# Patient Record
Sex: Male | Born: 1935 | Race: White | Hispanic: No | Marital: Married | Smoking: Former smoker
Health system: Southern US, Community
[De-identification: ages and names within clinical notes are randomized; demographics above are authoritative.]

## PROBLEM LIST (undated history)

## (undated) DIAGNOSIS — E119 Type 2 diabetes mellitus without complications: Secondary | ICD-10-CM

## (undated) DIAGNOSIS — F068 Other specified mental disorders due to known physiological condition: Secondary | ICD-10-CM

## (undated) DIAGNOSIS — I25810 Atherosclerosis of coronary artery bypass graft(s) without angina pectoris: Secondary | ICD-10-CM

## (undated) DIAGNOSIS — F341 Dysthymic disorder: Secondary | ICD-10-CM

## (undated) DIAGNOSIS — E785 Hyperlipidemia, unspecified: Secondary | ICD-10-CM

## (undated) DIAGNOSIS — I739 Peripheral vascular disease, unspecified: Secondary | ICD-10-CM

## (undated) DIAGNOSIS — H3530 Unspecified macular degeneration: Secondary | ICD-10-CM

## (undated) DIAGNOSIS — L84 Corns and callosities: Secondary | ICD-10-CM

## (undated) DIAGNOSIS — Z8619 Personal history of other infectious and parasitic diseases: Secondary | ICD-10-CM

## (undated) HISTORY — DX: Hyperlipidemia, unspecified: E78.5

## (undated) HISTORY — DX: Type 2 diabetes mellitus without complications: E11.9

## (undated) HISTORY — PX: CORONARY ARTERY BYPASS GRAFT: SHX141

## (undated) HISTORY — DX: Unspecified macular degeneration: H35.30

## (undated) HISTORY — DX: Atherosclerosis of coronary artery bypass graft(s) without angina pectoris: I25.810

## (undated) HISTORY — PX: OTHER SURGICAL HISTORY: SHX169

## (undated) HISTORY — DX: Other specified mental disorders due to known physiological condition: F06.8

## (undated) HISTORY — DX: Personal history of other infectious and parasitic diseases: Z86.19

## (undated) HISTORY — DX: Dysthymic disorder: F34.1

## (undated) HISTORY — DX: Peripheral vascular disease, unspecified: I73.9

## (undated) HISTORY — DX: Corns and callosities: L84

## (undated) HISTORY — PX: CARDIAC CATHETERIZATION: SHX172

---

## 1999-11-27 ENCOUNTER — Encounter: Payer: Self-pay | Admitting: Cardiothoracic Surgery

## 1999-11-27 ENCOUNTER — Inpatient Hospital Stay (HOSPITAL_COMMUNITY): Admission: AD | Admit: 1999-11-27 | Discharge: 1999-12-06 | Payer: Self-pay | Admitting: Cardiothoracic Surgery

## 1999-11-29 ENCOUNTER — Encounter: Payer: Self-pay | Admitting: Cardiothoracic Surgery

## 1999-11-30 ENCOUNTER — Encounter: Payer: Self-pay | Admitting: Cardiothoracic Surgery

## 1999-12-01 ENCOUNTER — Encounter: Payer: Self-pay | Admitting: Cardiothoracic Surgery

## 1999-12-02 ENCOUNTER — Encounter: Payer: Self-pay | Admitting: Cardiothoracic Surgery

## 2004-01-10 ENCOUNTER — Ambulatory Visit: Payer: Self-pay | Admitting: Internal Medicine

## 2004-02-02 ENCOUNTER — Encounter: Payer: Self-pay | Admitting: Internal Medicine

## 2004-03-01 ENCOUNTER — Encounter: Payer: Self-pay | Admitting: Internal Medicine

## 2004-03-29 ENCOUNTER — Encounter: Payer: Self-pay | Admitting: Internal Medicine

## 2004-04-29 ENCOUNTER — Encounter: Payer: Self-pay | Admitting: Internal Medicine

## 2004-05-22 ENCOUNTER — Ambulatory Visit: Payer: Self-pay | Admitting: Anesthesiology

## 2004-05-29 ENCOUNTER — Encounter: Payer: Self-pay | Admitting: Internal Medicine

## 2004-06-29 ENCOUNTER — Encounter: Payer: Self-pay | Admitting: Internal Medicine

## 2004-07-29 ENCOUNTER — Encounter: Payer: Self-pay | Admitting: Internal Medicine

## 2005-03-20 ENCOUNTER — Ambulatory Visit: Payer: Self-pay | Admitting: Gastroenterology

## 2005-03-26 ENCOUNTER — Ambulatory Visit: Payer: Self-pay | Admitting: Gastroenterology

## 2005-07-26 ENCOUNTER — Ambulatory Visit: Payer: Self-pay | Admitting: General Practice

## 2005-08-09 ENCOUNTER — Ambulatory Visit: Payer: Self-pay | Admitting: General Practice

## 2005-08-22 ENCOUNTER — Encounter: Payer: Self-pay | Admitting: General Practice

## 2005-08-28 ENCOUNTER — Emergency Department: Payer: Self-pay | Admitting: Emergency Medicine

## 2005-08-28 ENCOUNTER — Other Ambulatory Visit: Payer: Self-pay

## 2005-08-29 ENCOUNTER — Encounter: Payer: Self-pay | Admitting: General Practice

## 2006-08-06 ENCOUNTER — Ambulatory Visit: Payer: Self-pay | Admitting: Ophthalmology

## 2006-08-13 ENCOUNTER — Ambulatory Visit: Payer: Self-pay | Admitting: Ophthalmology

## 2007-04-19 ENCOUNTER — Emergency Department: Payer: Self-pay | Admitting: Emergency Medicine

## 2007-04-19 ENCOUNTER — Other Ambulatory Visit: Payer: Self-pay

## 2008-02-06 ENCOUNTER — Encounter: Payer: Self-pay | Admitting: Cardiovascular Disease

## 2008-02-10 ENCOUNTER — Encounter: Payer: Self-pay | Admitting: Cardiovascular Disease

## 2008-02-10 ENCOUNTER — Encounter: Payer: Self-pay | Admitting: *Deleted

## 2008-02-13 ENCOUNTER — Encounter: Payer: Self-pay | Admitting: Cardiovascular Disease

## 2008-02-19 ENCOUNTER — Inpatient Hospital Stay (HOSPITAL_BASED_OUTPATIENT_CLINIC_OR_DEPARTMENT_OTHER): Admission: RE | Admit: 2008-02-19 | Discharge: 2008-02-19 | Payer: Self-pay | Admitting: *Deleted

## 2008-03-23 ENCOUNTER — Encounter: Payer: Self-pay | Admitting: Cardiovascular Disease

## 2008-04-02 ENCOUNTER — Encounter: Payer: Self-pay | Admitting: Family Medicine

## 2008-05-26 ENCOUNTER — Inpatient Hospital Stay: Payer: Self-pay | Admitting: Internal Medicine

## 2008-10-01 ENCOUNTER — Ambulatory Visit: Payer: Self-pay | Admitting: Internal Medicine

## 2008-10-18 ENCOUNTER — Ambulatory Visit: Payer: Self-pay | Admitting: Gastroenterology

## 2008-11-29 ENCOUNTER — Encounter: Payer: Self-pay | Admitting: Cardiovascular Disease

## 2008-11-29 LAB — CONVERTED CEMR LAB
ALT: 16 units/L
AST: 14 units/L
Albumin: 4.3 g/dL
Alkaline Phosphatase: 45 units/L
BUN: 17 mg/dL
CO2: 31.2 meq/L
Calcium: 9.9 mg/dL
Chloride: 98 meq/L
Cholesterol: 178 mg/dL
Creatinine, Ser: 1 mg/dL
GFR calc Af Amer: >60 mL/min
GFR calc non Af Amer: >60 mL/min
Glucose, Bld: 169 mg/dL
HDL: 34.3 mg/dL
Hgb A1c MFr Bld: 7.1 %
LDL Cholesterol: 106.1 mg/dL
Potassium: 4.4 meq/L
Sodium: 136.9 meq/L
Total Bilirubin: 0.9 mg/dL
Total Protein: 7 g/dL
Triglyceride fasting, serum: 188 mg/dL

## 2009-02-15 ENCOUNTER — Ambulatory Visit: Payer: Medicare Other | Admitting: Unknown Physician Specialty

## 2009-06-14 ENCOUNTER — Ambulatory Visit: Payer: Self-pay | Admitting: Cardiovascular Disease

## 2009-06-14 DIAGNOSIS — E785 Hyperlipidemia, unspecified: Secondary | ICD-10-CM

## 2009-06-14 DIAGNOSIS — I25810 Atherosclerosis of coronary artery bypass graft(s) without angina pectoris: Secondary | ICD-10-CM

## 2009-06-16 ENCOUNTER — Encounter: Payer: Self-pay | Admitting: Cardiovascular Disease

## 2009-06-21 ENCOUNTER — Encounter: Payer: Self-pay | Admitting: Cardiovascular Disease

## 2009-06-30 ENCOUNTER — Telehealth: Payer: Self-pay | Admitting: Cardiovascular Disease

## 2009-08-18 ENCOUNTER — Telehealth: Payer: Self-pay | Admitting: Cardiovascular Disease

## 2009-09-05 ENCOUNTER — Encounter: Payer: Self-pay | Admitting: Cardiovascular Disease

## 2009-09-06 ENCOUNTER — Telehealth: Payer: Self-pay | Admitting: Cardiovascular Disease

## 2009-11-02 ENCOUNTER — Emergency Department: Payer: Medicare Other | Admitting: Emergency Medicine

## 2009-12-16 ENCOUNTER — Ambulatory Visit: Payer: Self-pay | Admitting: Cardiovascular Disease

## 2010-02-28 NOTE — Progress Notes (Signed)
Summary: bp medications  Phone Note Call from Patient Call back at Home Phone [phone]   Caller: self Call For: gollan Summary of Call: [facility] wants to know why the pt is on 3 different cholesterol medications-pt's wife would like a call back regarding this Initial call taken by: Harlon Flor,  September 06, 2009 11:37 AM  Follow-up for Phone Call        Wife is concerned that the pt is taking Plavix and aspirin 81 mg. Please advise.  Follow-up by: Benedict Needy, RN,  September 06, 2009 12:08 PM  Additional Follow-up for Phone Call Additional follow up Details #1::        He is on one cholesterol medication. Pravastatin 40 daily.  He is supposed to be alternating ASA 81 with plavix 75 every other day. He gets bad brusising. He is on these meds because he has severe CAD     Appended Document: bp medications LMOM TCB   Appended Document: bp medications spoke to wife she is aware of medications the pt is on and the reasons he is taking them.

## 2010-02-28 NOTE — Letter (Signed)
Summary: PHI  PHI   Imported By: Harlon Flor 06/15/2009 10:39:34  _____________________________________________________________________  External Attachment:    Type:   Image     Comment:   External Document

## 2010-02-28 NOTE — Assessment & Plan Note (Signed)
Summary: F6M/AMD   Visit Type:  Follow-up Referring Provider:  Dr Achilles Dunk Primary Provider:  Aram Beecham, MD  CC:  c/o some soreness in chest and back with exertion and just lying in bed.  He has pulled a muscle from working in the yard..  History of Present Illness: Shawn Zamora is a very pleasant 77 year old gentleman with a history of coronary artery disease, bypass surgery in 2001 with a vein graft to the LAD, vein graft to the ramus and vein graft to the PDA in October 2001, with catheterization for shortness of breath in January 2010 showing proximal LAD occlusion with left to left collaterals, 100% mid RCA disease, patent vein graft to the RCA, patent vein graft to the ramus with occluded vein graft to the LAD, ejection fraction 40%. He presents for routine followup.  He has a history of shortness of breath, generalized weakness requiring physical therapy. He does have rare episodes of left-sided chest pain when he sleeps on his left side though does not report significant chest discomfort with exertion. No significant lightheadedness or dizziness.  He does report significant bleeding from his nose and had 2 episodes that took him to the emergency room. He takes Plavix every other day. He had had his nose cauterized due to bleeding. 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  EKG shows normal sinus rhythm with rate 73 beats per minute, old inferior infarct, no other significant ST or T wave changes  Current Medications (verified): 1)  Glimepiride 4 Mg Tabs (Glimepiride) .Marland Kitchen.. 1 Tablet Two Times A Day 2)  Sertraline Hcl 50 Mg Tabs (Sertraline Hcl) .... 2 At Night 3)  Plavix 75 Mg Tabs (Clopidogrel Bisulfate) .... Take One Tablet By Mouth Every Other Day 4)  Omeprazole 20 Mg Cpdr (Omeprazole) .Marland Kitchen.. 1 Tablet Two Times A Day 5)  Ramipril 2.5 Mg Caps (Ramipril) .... Take One Capsule By Mouth Daily 6)  Aricept 5 Mg Tabs (Donepezil Hcl) .Marland Kitchen.. 1po Once Daily 7)  Lyrica 75  Mg Caps (Pregabalin) .... 3 By Mouth Daily If Needed Can Take More 8)  Lorazepam 0.5 Mg Tabs (Lorazepam) .Marland Kitchen.. 1 By Mouth Once Daily As Needed 9)  Metoprolol Tartrate 25 Mg Tabs (Metoprolol Tartrate) .... Take One Tablet By Mouth Twice A Day 10)  Imipramine Hcl 25 Mg Tabs (Imipramine Hcl) .Marland Kitchen.. 1 Tablet At Bedtime 11)  Valtrex 500 Mg Tabs (Valacyclovir Hcl) .... One Tablet Once Daily 12)  Pravastatin Sodium 40 Mg Tabs (Pravastatin Sodium) .... One Tablet At Bedtime  Allergies (verified): 1)  ! Codeine  Past History:  Past Surgical History: Last updated: 2009-07-14 cabg  Family History: Last updated: 2009-07-14 Father: deceased 52: tumor of the brain Mother: deceased 51: old age Sister: deceased 12: old age Brother: deceased 59: brain tumor  Social History: Last updated: Jul 14, 2009 Retired  Married  Tobacco Use - Former.  Alcohol Use - no Regular Exercise - no Drug Use - no  Risk Factors: Alcohol Use: 0 (14-Jul-2009) Caffeine Use: 2 cups per day (Jul 14, 2009) Exercise: no (2009/07/14)  Risk Factors: Smoking Status: quit (07-14-2009)  Review of Systems       The patient complains of difficulty walking.  The patient denies fever, weight loss, weight gain, vision loss, decreased hearing, hoarseness, chest pain, syncope, dyspnea on exertion, peripheral edema, prolonged cough, abdominal pain, incontinence, muscle weakness, depression, and enlarged lymph nodes.    Vital Signs:  Patient profile:   77 year old male Height:  74 inches Weight:      202.75 pounds BMI:     26.13 Pulse rate:   73 / minute BP sitting:   112 / 72  (left arm) Cuff size:   regular  Vitals Entered By: Adrian Prince (December 16, 2009 2:52 PM)  Physical Exam  General:  well-appearing elderly gentleman in no apparent distress, HEENT exam is benign, or perhaps is clear, neck is supple with no JVP or carotid bruits, heart sounds are regular with S1-S2 and no murmurs appreciated, lungs are  clear to auscultation with no wheezes Rales, abdominal exam is benign, no significant lower extremity edema, neurologic exam is grossly nonfocal, skin is warm and dry. Pulses are equal and symmetrical in his upper and lower extremities. Skin:  Intact without lesions or rashes. Psych:  Normal affect.   Impression & Recommendations:  Problem # 1:  CAD, ARTERY BYPASS GRAFT (ICD-414.04) Severe underlying coronary artery disease. We'll continue aggressive lipid management. Currently with no chest pain and no further workup needed.Marland Kitchen He is taking Plavix every other day secondary to significant nosebleeds requiring cauterization.  The following medications were removed from the medication list:    Aspirin 81 Mg Tbec (Aspirin) .Marland Kitchen... Take one tablet by mouth every other day His updated medication list for this problem includes:    Plavix 75 Mg Tabs (Clopidogrel bisulfate) .Marland Kitchen... Take one tablet by mouth every other day    Ramipril 2.5 Mg Caps (Ramipril) .Marland Kitchen... Take one capsule by mouth daily    Metoprolol Tartrate 25 Mg Tabs (Metoprolol tartrate) .Marland Kitchen... Take one tablet by mouth twice a day  Problem # 2:  HYPERLIPIDEMIA-MIXED (ICD-272.4) we will start him on Crestor 20 mg daily as his LDL is above goal. Ideally goal LDL should be less than 70 given his severe coronary artery disease. Once Lipitor dose generic, we will change him to Lipitor 40 mg daily. [facility]  The following medications were removed from the medication list:    Crestor 10 Mg Tabs (Rosuvastatin calcium) .Marland Kitchen... Take one tablet by mouth daily.    Crestor 20 Mg Tabs (Rosuvastatin calcium) .Marland Kitchen... Take one tablet by mouth daily. His updated medication list for this problem includes:    Lipitor 40 Mg Tabs (Atorvastatin calcium) .Marland Kitchen... Take one tablet by mouth daily.  Problem # 3:  DYSPNEA (ICD-786.05) He reports that his shortness of breath is stable. He is otherwise active with no significant complaints. No  signs of heart failure at this time.  The following medications were removed from the medication list:    Aspirin 81 Mg Tbec (Aspirin) .Marland Kitchen... Take one tablet by mouth every other day His updated medication list for this problem includes:    Ramipril 2.5 Mg Caps (Ramipril) .Marland Kitchen... Take one capsule by mouth daily    Metoprolol Tartrate 25 Mg Tabs (Metoprolol tartrate) .Marland Kitchen... Take one tablet by mouth twice a day  Patient Instructions: 1)  Your physician recommends that you schedule a follow-up appointment in: 6 months 2)  Your physician recommends that you return for a FASTING lipid profile: in 3 months (Lipid/Liver) 3)  Your physician has recommended you make the following change in your medication: Stop Pravastatin, Start Crestor samples 20mg  daily, once samples completed then fill Lipitor 40mg  once daily prescription.  Prescriptions: RAMIPRIL 2.5 MG CAPS (RAMIPRIL) Take one capsule by mouth daily  #90 x 3   Entered by:   Cloyde Reams RN   Authorized by:   Dossie Arbour MD  Signed by:   Cloyde Reams RN on 12/16/2009   Method used:   Faxed to ...       [facility] ([facility]order)       [address] [place], [place]  [address]       Ph: [phone]       Fax: [phone]   RxID:   [id number] METOPROLOL TARTRATE 25 MG TABS (METOPROLOL TARTRATE) Take one tablet by mouth twice a day  #180 x 3   Entered by:   Cloyde Reams RN   Authorized by:   Dossie Arbour MD   Signed by:   Cloyde Reams RN on 12/16/2009   Method used:   Faxed to ...       [facility] (mail[facility])       [address] [place], [place]  [address]       Ph: [phone]       Fax: [phone]   RxID:   [id number] LIPITOR 40 MG TABS (ATORVASTATIN CALCIUM) Take one tablet by mouth daily.  #30 x 1   Entered by:   Cloyde Reams RN   Authorized by:   Dossie Arbour MD   Signed by:   Cloyde Reams RN on 12/16/2009   Method used:   Electronically to        [facility] [phone]* (retail)       [address] [place], [place]  [address]       Ph: [phone] or [phone]       Fax: [phone]   RxID:   [id number] CRESTOR 20 MG TABS (ROSUVASTATIN CALCIUM) Take one tablet by mouth daily.  #42 x 0   Entered by:   Cloyde Reams RN   Authorized by:   Dossie Arbour MD   Signed by:   Cloyde Reams RN on 12/16/2009   Method used:   Samples Given   RxID:   [id number] RAMIPRIL 2.5 MG CAPS (RAMIPRIL) Take one capsule by mouth daily  #90 x 3   Entered by:   Cloyde Reams RN   Authorized by:   Dossie Arbour MD   Signed by:   Cloyde Reams RN on 12/16/2009   Method used:   Electronically to        [facility] [phone]* (retail)       [address] [place], [place]  [address]       Ph: [phone] or [phone]       Fax: [phone]   RxID:   [id number] METOPROLOL TARTRATE 25 MG TABS (METOPROLOL TARTRATE) Take one tablet by mouth twice a day  #180 x 3   Entered by:   Cloyde Reams RN   Authorized by:   Dossie Arbour MD   Signed by:   Cloyde Reams RN on 12/16/2009   Method used:   Electronically to        [facility] [phone]* (retail)       [address] [place], [place]  [address]       Ph: [phone] or [phone]       Fax: [phone]   RxID:   [id number] LIPITOR 40 MG TABS (  ATORVASTATIN CALCIUM) Take one tablet by mouth daily.  #90 x 3   Entered by:   Cloyde Reams RN   Authorized by:   Dossie Arbour MD   Signed by:   Cloyde Reams RN on 12/16/2009   Method used:   Electronically to        [facility] [phone]* (retail)       [address] [place], [place]  [address]       Ph: [phone] or [phone]       Fax: [phone]   RxID:   [id number]

## 2010-02-28 NOTE — Letter (Signed)
Summary: [facility]  [facility]   Imported By: Harlon Flor 09/06/2009 09:17:59  _____________________________________________________________________  External Attachment:    Type:   Image     Comment:   External Document

## 2010-02-28 NOTE — Miscellaneous (Signed)
Summary: Heart Track Order  Heart Track Order   Imported By: Harlon Flor 06/23/2009 12:32:34  _____________________________________________________________________  External Attachment:    Type:   Image     Comment:   External Document

## 2010-02-28 NOTE — Letter (Signed)
Summary: Medical Record Release  Medical Record Release   Imported By: Harlon Flor 09/08/2009 10:52:55  _____________________________________________________________________  External Attachment:    Type:   Image     Comment:   External Document

## 2010-02-28 NOTE — Progress Notes (Signed)
Summary: CLEARANCE  Phone Note Call from Patient Call back at Home Phone [phone]   Caller: Patient Call For: [facility] Summary of Call: PATIENT'S WIFE CALLED AND HUSBAND IS HAVING CORNEAL TRANSPLANT IN AUGUST AT [facility], WIFE WANTS TO KNOW IF DR.Khyren Hing THINKS PT IS STABLE ENOUGH TO GO THROUGH PROCEDURE. Initial call taken by: [place],  August 18, 2009 10:17 AM  Follow-up for Phone Call        pt last seen in office 5/17 Benedict Needy, RN  August 18, 2009 10:57 AM      Appended Document: CLEARANCE Cholesterol is too high Need to change pravastatin 40 to  crestor 10 mg daily (samples)  Is his breathing any better? Exercising? Does he want stress test to make sure no blockage? Would probably be ok for corneal transplant.   Appended Document: CLEARANCE    Clinical Lists Changes  Medications: Removed medication of PRAVASTATIN SODIUM 40 MG TABS (PRAVASTATIN SODIUM) Take one tablet by mouth daily at bedtime Added new medication of CRESTOR 10 MG TABS (ROSUVASTATIN CALCIUM) Take one tablet by mouth daily. - Signed Rx of CRESTOR 10 MG TABS (ROSUVASTATIN CALCIUM) Take one tablet by mouth daily.;  #28 x 0;  Signed;  Entered by: Benedict Needy, RN;  Authorized by: Dossie Arbour MD;  Method used: Samples Given    Prescriptions: CRESTOR 10 MG TABS (ROSUVASTATIN CALCIUM) Take one tablet by mouth daily.  #28 x 0   Entered by:   Benedict Needy, RN   Authorized by:   Dossie Arbour MD   Signed by:   Benedict Needy, RN on 08/19/2009   Method used:   Samples Given   RxID:   [id number]

## 2010-02-28 NOTE — Miscellaneous (Signed)
Summary: labs 11.01.10  Clinical Lists Changes  Observations: Added new observation of GFRAA: >60 (11/29/2008 7:33) Added new observation of GFR: >60 (11/29/2008 7:33) Added new observation of ALBUMIN: 4.3 g/dL (57/84/6962 9:52) Added new observation of PROTEIN, TOT: 7.0 g/dL (84/13/2440 1:02) Added new observation of CALCIUM: 9.9 mg/dL (72/53/6644 0:34) Added new observation of ALK PHOS: 45 units/L (11/29/2008 7:33) Added new observation of BILI TOTAL: 0.9 mg/dL (74/25/9563 8:75) Added new observation of SGPT (ALT): 16 units/L (11/29/2008 7:33) Added new observation of SGOT (AST): 14 units/L (11/29/2008 7:33) Added new observation of CO2 PLSM/SER: 31.2 meq/L (11/29/2008 7:33) Added new observation of CL SERUM: 98 meq/L (11/29/2008 7:33) Added new observation of K SERUM: 4.4 meq/L (11/29/2008 7:33) Added new observation of NA: 136.9 meq/L (11/29/2008 7:33) Added new observation of CREATININE: 1.0 mg/dL (64/33/2951 8:84) Added new observation of BUN: 17 mg/dL (16/60/6301 6:01) Added new observation of BG RANDOM: 169 mg/dL (09/32/3557 3:22) Added new observation of HGBA1C: 7.1 % (11/29/2008 7:32) Added new observation of TRIGLYCERIDE: 188 mg/dL (02/54/2706 2:37) Added new observation of HDL: 34.3 mg/dL (62/83/1517 6:16) Added new observation of LDL: 106.1 mg/dL (07/37/1062 6:94) Added new observation of CHOLESTEROL: 178 mg/dL (85/46/2703 5:00)      -  Date:  11/29/2008    Cholesterol: 178    LDL: 106.1    HDL: 34.3    Triglycerides: 188    HgbA1c: 7.1    BG Random: 169    BUN: 17    Creatinine: 1.0    Sodium: 136.9    Potassium: 4.4    Chloride: 98    CO2 Total: 31.2    SGOT (AST): 14    SGPT (ALT): 16    T. Bilirubin: 0.9    Alk Phos: 45    Calcium: 9.9    Total Protein: 7.0    Albumin: 4.3    GFR(Non African American): >60    GFR(African American) >60

## 2010-02-28 NOTE — Assessment & Plan Note (Signed)
Summary: NP6/AMD   Visit Type:  NP6 Referring Provider:  Dr Achilles Dunk Primary Provider:  Aram Beecham, MD  CC:  some chest pains and bruise his chest on the side by bending over a chair. some issues with reflux. shortness of breath with any physical exercise. no edema in ankles or feet. feeling tired and give out. Marland Kitchen  History of Present Illness: Shawn Zamora is a very pleasant 77 year old gentleman with a history of coronary artery disease, bypass surgery in 2001 with a vein graft to the LAD, vein graft to the ramus and vein graft to the PDA in October 2001, with catheterization for shortness of breath in January 2010 showing proximal LAD occlusion with left to left collaterals, 100% mid RCA disease, patent vein graft to the RCA, patent vein graft to the ramus with occluded vein graft to the LAD, ejection fraction 40%.  He states that since the fall, he's had worsening shortness of breath. He use to participate in the [facility] physical therapy/rehabilitation program and since he stopped, his conditioning has teary related and he is up and walking to any significant degree. He and his wife states that they watch TV, has not had sex in many years and to not do any exercise. His weight has been relatively stable. He denies any significant chest pain with exertion but does have shortness of breath.  He states that his metoprolol was stopped for some reason many months ago. His aspirin was also stopped and he takes Plavix every other day.  Preventive Screening-Counseling & Management  Alcohol-Tobacco     Alcohol drinks/day: 0     Smoking Status: quit  Caffeine-Diet-Exercise     Caffeine use/day: 2 cups per day     Does Patient Exercise: no      Drug Use:  no.    Current Medications (verified): 1)  Glimepiride 4 Mg Tabs (Glimepiride) .Marland Kitchen.. 1 By Mouth Once Daily 2)  Sertraline Hcl 50 Mg Tabs (Sertraline Hcl) .... 2 At Night 3)  Plavix 75 Mg Tabs (Clopidogrel Bisulfate) .... Take One Tablet By Mouth  Every Other Day 4)  Omeprazole 20 Mg Cpdr (Omeprazole) .Marland Kitchen.. 1po Once Daily 5)  Ramipril 2.5 Mg Caps (Ramipril) .... Take One Capsule By Mouth Daily 6)  Aricept 5 Mg Tabs (Donepezil Hcl) .Marland Kitchen.. 1po Once Daily 7)  Pravastatin Sodium 40 Mg Tabs (Pravastatin Sodium) .... Take One Tablet By Mouth Daily At Bedtime 8)  Lyrica 75 Mg Caps (Pregabalin) .... 3 By Mouth Daily If Needed Can Take More 9)  Lorazepam 0.5 Mg Tabs (Lorazepam) .Marland Kitchen.. 1 By Mouth Once Daily As Needed  Allergies (verified): 1)  ! Codeine  Past History:  Past Surgical History: cabg  Family History: Father: deceased 11: tumor of the brain Mother: deceased 78: old age Sister: deceased 54: old age Brother: deceased 59: brain tumor  Social History: Retired  Married  Tobacco Use - Former.  Alcohol Use - no Regular Exercise - no Drug Use - no Alcohol drinks/day:  0 Smoking Status:  quit Caffeine use/day:  2 cups per day Does Patient Exercise:  no Drug Use:  no  Review of Systems       The patient complains of dyspnea on exertion.  The patient denies fever, weight loss, weight gain, vision loss, decreased hearing, hoarseness, chest pain, syncope, peripheral edema, prolonged cough, abdominal pain, incontinence, muscle weakness, depression, and enlarged lymph nodes.    Vital Signs:  Patient profile:   77 year old male Height:  74 inches Weight:      200.50 pounds BMI:     25.84 Pulse rate:   91 / minute Pulse rhythm:   regular BP sitting:   94 / 68  (left arm) Cuff size:   regular  Vitals Entered By: Shawn Zamora (Jun 14, 2009 4:10 PM)  Physical Exam  General:  well-appearing elderly gentleman in no apparent distress, HEENT exam is benign, or perhaps is clear, neck is supple with no JVP or carotid bruits, heart sounds are regular with S1-S2 and no murmurs appreciated, lungs are clear to auscultation with no wheezes Rales, abdominal exam is benign, no significant lower extremity edema, neurologic exam is  grossly nonfocal, skin is warm and dry. Pulses are equal and symmetrical in his upper and lower extremities.    EKG  Procedure date:  06/14/2009  Findings:      normal sinus rhythm with APCs, rate 91 beats per minute, Q waves inferiorly consistent with old inferior infarct.  Impression & Recommendations:  Problem # 1:  CAD, ARTERY BYPASS GRAFT (ICD-414.04)  history of bypass surgery in 2001. We have evaluated his angiogram, the pictures and the report. He does have an occluded vein graft to the LAD. After discussion with him, it is difficult to determine if his shortness of breath is chronic and due to inactivity versus worsening coronary arterial disease. We've asked him to try to exercise for several weeks and if he does not feel like he is making any improvement, we could perform a pharmacologic stress test at that time.  He is asked [place] to about 6 and we have suggested that he try to get his conditioning a little better before engaging in this. He also states that he would like to try Viagra.  We will restart his metoprolol though at lower dose of 12.5 mg b.i.d. and will have them call him with his heart rates and blood pressures over the next week or 2.  We will have him try aspirin 81 mg every other day alternating with his Plavix 81 mg every other day so he has some antiplatelet activity every day. I hope to advance him to a baby aspirin every day if tolerated though he states he has significant bruising on his arms.  His updated medication list for this problem includes:    Plavix 75 Mg Tabs (Clopidogrel bisulfate) .Marland Kitchen... Take one tablet by mouth every other day    Ramipril 2.5 Mg Caps (Ramipril) .Marland Kitchen... Take one capsule by mouth daily    Metoprolol Tartrate 25 Mg Tabs (Metoprolol tartrate) .Marland Kitchen... Take one tablet by mouth twice a day    Aspirin 81 Mg Tbec (Aspirin) .Marland Kitchen... Take one tablet by mouth every other day  His updated medication list for this problem includes:    Plavix 75  Mg Tabs (Clopidogrel bisulfate) .Marland Kitchen... Take one tablet by mouth every other day    Ramipril 2.5 Mg Caps (Ramipril) .Marland Kitchen... Take one capsule by mouth daily    Metoprolol Tartrate 25 Mg Tabs (Metoprolol tartrate) .Marland Kitchen... Take one tablet by mouth twice a day  Problem # 2:  HYPERLIPIDEMIA-MIXED (ICD-272.4)  We'll try to obtain a copy of his most recent lipid panel.  His updated medication list for this problem includes:    Pravastatin Sodium 40 Mg Tabs (Pravastatin sodium) .Marland Kitchen... Take one tablet by mouth daily at bedtime  His updated medication list for this problem includes:    Pravastatin Sodium 40 Mg Tabs (Pravastatin sodium) .Marland Kitchen... Take one tablet by mouth  daily at bedtime  Problem # 3:  DYSPNEA (ICD-786.05)  Again, it is difficult to determine if his shortness of breath is due to a deconditioned state or to worsening coronary disease. We will have him exercise and perform a stress test at a later date if he has no improvement in SOB.Marland Kitchen  His updated medication list for this problem includes:    Ramipril 2.5 Mg Caps (Ramipril) .Marland Kitchen... Take one capsule by mouth daily    Metoprolol Tartrate 25 Mg Tabs (Metoprolol tartrate) .Marland Kitchen... Take one tablet by mouth twice a day    Aspirin 81 Mg Tbec (Aspirin) .Marland Kitchen... Take one tablet by mouth every other day  His updated medication list for this problem includes:    Ramipril 2.5 Mg Caps (Ramipril) .Marland Kitchen... Take one capsule by mouth daily    Metoprolol Tartrate 25 Mg Tabs (Metoprolol tartrate) .Marland Kitchen... Take one tablet by mouth twice a day  Patient Instructions: 1)  Your physician has recommended you make the following change in your medication: Start taking Metoprolol 25mg  1/2 tablet two times a day to start. Start taking 81mg  of aspirin every other day.  2)  Your physician has requested that you regularly monitor and record your blood pressure readings at home.  Please use the same machine at the same time of day to check your readings and call in 2 weeks with readings.     Prescriptions: CIALIS 5 MG TABS (TADALAFIL) Take 1 tablet by mouth once a day  #30 x 0   Entered by:   Cloyde Reams RN   Authorized by:   Dossie Arbour MD   Signed by:   Cloyde Reams RN on 06/14/2009   Method used:   Print then Give to Patient   RxID:   [id number] CIALIS 5 MG TABS (TADALAFIL) Take 1 tablet by mouth once a day  #30 x 0   Entered by:   Cloyde Reams RN   Authorized by:   Dossie Arbour MD   Signed by:   Cloyde Reams RN on 06/14/2009   Method used:   Electronically to        [facility] [phone]* (retail)       [address] [place], [place]  [address]       Ph: [phone] or [phone]       Fax: [phone]   RxID:   [id number] METOPROLOL TARTRATE 25 MG TABS (METOPROLOL TARTRATE) Take one tablet by mouth twice a day  #60 x 6   Entered by:   Cloyde Reams RN   Authorized by:   Dossie Arbour MD   Signed by:   Cloyde Reams RN on 06/14/2009   Method used:   Electronically to        [facility] [phone]* (retail)       [address] [place], [place]  [address]       Ph: [phone] or [phone]       Fax: [phone]   RxID:   [id number]

## 2010-02-28 NOTE — Progress Notes (Signed)
Summary: BP READINGS AND QUESTIONS  Phone Note Call from Patient Call back at Home Phone [phone]   Caller: WIFE Call For: GOLLAN Summary of Call: BP READINGS FOR THE PAST 2 WEEKS: 147/79-139/87-138/76-128/88-124/80-129/87-123/74-128/77-134/63-129/86 IN LEFT ARM 126/73 IN RIGHT ARM-126/74-124/69-123/69-124/76 THESE ARE ALL MORNING READINGS-THE EVENING READINGS: 130/76-110/65-121/80-130/76-123/79-120/72-117/72-120/72-125/72-110/71-117/72-110/62-HEART RATES ARE:73-74-76-62-68-78-75-82-63-77-82-74-57-77-79-79-50-74-80-55-85-81-68-PT WOULD LIKE TO KNOW WHAT THE PREVASTATIN IS FOR-PT DOES NOT SEE ANY CHANGES IN HIS BP READINGS FROM A MONTH AGO Initial call taken by: Harlon Flor,  June 30, 2009 4:03 PM  Follow-up for Phone Call        Talked to wife [facility]. Benedict Needy, RN  June 30, 2009 6:22 PM  [facility] Benedict Needy, RN  July 01, 2009 2:54 PM   per last ov note pt started on metoprolol 25 mg 1/2 tablet two times a day instructed to call in 2 weeks withh bp and hr.  Follow-up by: Benedict Needy, RN,  July 04, 2009 9:53 AM    Additional Follow-up for Phone Call Additional follow up Details #2::    Blood pressure and heart rate are good. Would continue metoprolol. Pravastatin is for cholesterol...which she needs.      Appended Document: BP READINGS AND QUESTIONS pt aware of Dr. Ethelene Hal recomendations.

## 2010-03-20 ENCOUNTER — Encounter: Payer: Self-pay | Admitting: Cardiovascular Disease

## 2010-03-20 ENCOUNTER — Other Ambulatory Visit (INDEPENDENT_AMBULATORY_CARE_PROVIDER_SITE_OTHER): Payer: Medicare Other

## 2010-03-20 DIAGNOSIS — E785 Hyperlipidemia, unspecified: Secondary | ICD-10-CM

## 2010-03-21 LAB — CONVERTED CEMR LAB
AST: 13 units/L (ref 0–37)
Albumin: 4.3 g/dL (ref 3.5–5.2)
Bilirubin, Direct: 0.2 mg/dL (ref 0.0–0.3)
HDL: 39 mg/dL — ABNORMAL LOW (ref >39)
LDL Cholesterol: 86 mg/dL (ref 0–99)
Total Bilirubin: 0.9 mg/dL (ref 0.3–1.2)
Total CHOL/HDL Ratio: 4.1
VLDL: 35 mg/dL (ref 0–40)

## 2010-04-03 LAB — HM DIABETES EYE EXAM

## 2010-04-04 ENCOUNTER — Other Ambulatory Visit: Payer: Self-pay | Admitting: Family Medicine

## 2010-04-04 ENCOUNTER — Encounter: Payer: Self-pay | Admitting: Family Medicine

## 2010-04-04 ENCOUNTER — Ambulatory Visit (INDEPENDENT_AMBULATORY_CARE_PROVIDER_SITE_OTHER): Payer: Medicare Other | Admitting: Family Medicine

## 2010-04-04 DIAGNOSIS — F068 Other specified mental disorders due to known physiological condition: Secondary | ICD-10-CM

## 2010-04-04 DIAGNOSIS — G3184 Mild cognitive impairment, so stated: Secondary | ICD-10-CM | POA: Insufficient documentation

## 2010-04-04 DIAGNOSIS — E785 Hyperlipidemia, unspecified: Secondary | ICD-10-CM

## 2010-04-04 DIAGNOSIS — E119 Type 2 diabetes mellitus without complications: Secondary | ICD-10-CM

## 2010-04-04 DIAGNOSIS — F321 Major depressive disorder, single episode, moderate: Secondary | ICD-10-CM | POA: Insufficient documentation

## 2010-04-04 DIAGNOSIS — F341 Dysthymic disorder: Secondary | ICD-10-CM

## 2010-04-04 DIAGNOSIS — L84 Corns and callosities: Secondary | ICD-10-CM | POA: Insufficient documentation

## 2010-04-04 DIAGNOSIS — I739 Peripheral vascular disease, unspecified: Secondary | ICD-10-CM

## 2010-04-04 DIAGNOSIS — Z79899 Other long term (current) drug therapy: Secondary | ICD-10-CM

## 2010-04-04 LAB — BASIC METABOLIC PANEL
CO2: 32 mEq/L (ref 19–32)
Calcium: 9.8 mg/dL (ref 8.4–10.5)
Chloride: 99 mEq/L (ref 96–112)
Glucose, Bld: 101 mg/dL — ABNORMAL HIGH (ref 70–99)
Potassium: 4.3 mEq/L (ref 3.5–5.1)
Sodium: 139 mEq/L (ref 135–145)

## 2010-04-10 ENCOUNTER — Telehealth: Payer: Self-pay | Admitting: Family Medicine

## 2010-04-11 NOTE — Miscellaneous (Signed)
Summary: Health Care Power of [facility] Power of Attorney   Imported By: Maryln Gottron 04/05/2010 15:56:13  _____________________________________________________________________  External Attachment:    Type:   Image     Comment:   External Document

## 2010-04-11 NOTE — Assessment & Plan Note (Signed)
Summary: new medicare pt   Vital Signs:  Patient profile:   77 year old male Height:      73 inches Weight:      200.75 pounds BMI:     26.58 Temp:     97.9 degrees F oral Pulse rate:   76 / minute Pulse rhythm:   regular BP sitting:   110 / 72  (left arm) Cuff size:   regular  Vitals Entered By: Benny Lennert CMA (AAMA) (April 04, 2010 10:00 AM)  History of Present Illness: Chief complaint new medicare patient  Here to establish.. previously saw Dr. Judithann Sheen. Last saw him few months ago.   DM... CBG 90-120 on glimperide and recetnly added janumet   History of coronary artery disease, bypass surgery in 2001 with a vein graft to the LAD, vein graft to the ramus and vein graft to the PDA in October 2001, with catheterization for shortness of breath in January 2010 showing proximal LAD occlusion with left to left collaterals, 100% mid RCA disease, patent vein graft to the RCA, patent vein graft to the ramus with occluded vein graft to the LAD, ejection fraction 40%. Sees Dr. Mariah Milling.. last note 11/2009.  No further dyspnea per pt today. On metoprolol and ramipril.  High cholesterol... on liptior 40 mg daily. Working on [employer].  Anxiety/depression.. has been on sertraline for quite some time. On imipramine at bedtime.. without this a lot of anxiety.  Only using ativan for anxiety... about twice a month.  Dementia.. stable on aricept  Hx of shingles 10 years ago .Marland Kitchen left eye /cornea replacement.Marland Kitchen on lyrica for pain/itching... three times a day... started 10 years ago 1 year after symptoms did not resolve.  Nothing else helped.  Corns and calluses.. sees Podiatrist for maintanance... [facility].  PAD.Marland Kitchen 1 year.Marland Kitchen abnormal ABIs.Marland Kitchen at vascular MD.. told needs to follow.  Dr. Forde Dandy.   Allergies: 1)  ! Codeine  Physical Exam  General:  overweight male in NAD  Eyes:  No corneal or conjunctival inflammation noted. EOMI. Perrla. Funduscopic exam benign, without hemorrhages,  exudates or papilledema. Vision grossly normal. Ears:  External ear exam shows no significant lesions or deformities.  Otoscopic examination reveals clear canals, tympanic membranes are intact bilaterally without bulging, retraction, inflammation or discharge. Hearing is grossly normal bilaterally. Nose:  External nasal examination shows no deformity or inflammation. Nasal mucosa are pink and moist without lesions or exudates. Mouth:  poor dentition Neck:  no carotid bruit or thyromegaly no cervical or supraclavicular lymphadenopathy  Lungs:  Normal respiratory effort, chest expands symmetrically. Lungs are clear to auscultation, no crackles or wheezes. Heart:  distant heart sounds, no murmer, no rubs, no gallops Abdomen:  Bowel sounds positive,abdomen soft and non-tender without masses, organomegaly or hernias noted. Pulses:  1 plus B pedal pulses Extremities:  no edema Skin:  Intact without suspicious lesions or rashes Psych:  Cognition and judgment appear intact. Alert and cooperative with normal attention span and concentration. No apparent delusions, illusions, hallucinations   Impression & Recommendations:  Problem # 1:  DIABETES MELLITUS (ICD-250.00) Due fro reeval. Encouraged exercise, weight loss, healthy eating habits.  His updated medication list for this problem includes:    Glimepiride 4 Mg Tabs (Glimepiride) .Marland Kitchen... 1 tablet two times a day    Ramipril 2.5 Mg Caps (Ramipril) .Marland Kitchen... Take one capsule by mouth daily    Janumet 50-500 Mg Tabs (Sitagliptin-metformin hcl) .Marland Kitchen... 1 tab by mouth daily  Orders: TLB-A1C / Hgb A1C (  Glycohemoglobin) (83036-A1C) TLB-BMP (Basic Metabolic Panel-BMET) (80048-METABOL)  Problem # 2:  HYPERLIPIDEMIA-MIXED (ICD-272.4) recent eval.. almost at goal on lipitor. Work on diet and exercise. Recheck in 3 months.  His updated medication list for this problem includes:    Lipitor 40 Mg Tabs (Atorvastatin calcium) .Marland Kitchen... Take one tablet by mouth  daily.  Problem # 3:  ANXIETY DEPRESSION (ICD-300.4) On many medicaitons... he does have come dizziness, constipation, and fatigue.. ? SE to all him meds.  Will consider try to wean off some of these as able.   Limit ativan use.   Problem # 4:  ? of PERIPHERAL VASCULAR DISEASE (ICD-443.9) Return to vascular MD for reeval.  His updated medication list for this problem includes:    Plavix 75 Mg Tabs (Clopidogrel bisulfate) .Marland Kitchen... Take one tablet by mouth every other day  Problem # 5:  DEMENTIA (ICD-294.8) Stable on aricept.   Complete Medication List: 1)  Glimepiride 4 Mg Tabs (Glimepiride) .Marland Kitchen.. 1 tablet two times a day 2)  Sertraline Hcl 50 Mg Tabs (Sertraline hcl) .... 2 at night 3)  Plavix 75 Mg Tabs (Clopidogrel bisulfate) .... Take one tablet by mouth every other day 4)  Omeprazole 20 Mg Cpdr (Omeprazole) .Marland Kitchen.. 1 tablet two times a day 5)  Ramipril 2.5 Mg Caps (Ramipril) .... Take one capsule by mouth daily 6)  Aricept 5 Mg Tabs (Donepezil hcl) .Marland Kitchen.. 1po once daily 7)  Lyrica 75 Mg Caps (Pregabalin) .... 3 by mouth daily if needed can take more 8)  Lorazepam 0.5 Mg Tabs (Lorazepam) .Marland Kitchen.. 1 by mouth once daily as needed 9)  Metoprolol Tartrate 25 Mg Tabs (Metoprolol tartrate) .... Take one tablet by mouth twice a day 10)  Imipramine Hcl 25 Mg Tabs (Imipramine hcl) .Marland Kitchen.. 1 tablet at bedtime 11)  Valtrex 500 Mg Tabs (Valacyclovir hcl) .... One tablet once daily 12)  Lipitor 40 Mg Tabs (Atorvastatin calcium) .... Take one tablet by mouth daily. 13)  Ocuvite-lutein Caps (Multiple vitamins-minerals) .... Take one tablet in am and one in pm 14)  Potassium Gluconate 550 Mg Tabs (Potassium gluconate) .... Take one tablet daily 15)  Multivitamins Caps (Multiple vitamin) .... Take one tablet dialy 16)  Glucosamine-chondroitin 1500-1200 Mg/41ml Liqd (Glucosamine-chondroitin) .... Take one tablet dialy 17)  Janumet 50-500 Mg Tabs (Sitagliptin-metformin hcl) .Marland Kitchen.. 1 tab by mouth daily  Patient  Instructions: 1)  Work on increasing exercise and weight loss. 2)   Decrease butter in diet... try to replace with olive oil/ canola. 3)  Please schedule a follow-up appointment in 3 months .  4)  BMP prior to visit, ICD-9: 250.00 all 5)  Hepatic Panel prior to visit ICD-9:  6)  Lipid panel prior to visit ICD-9 :  7)  HgBA1c prior to visit  ICD-9:  8)  Urine Microalbumin prior to visit ICD-9 :  9)  Return to see vascular doctor for recheck of legs.   Orders Added: 1)  TLB-A1C / Hgb A1C (Glycohemoglobin) [83036-A1C] 2)  TLB-BMP (Basic Metabolic Panel-BMET) [80048-METABOL] 3)  Est. Patient Level IV [04540] 4)  New Patient Level III [98119]    Current Allergies (reviewed today): ! CODEINE  Flu Vaccine Result Date:  01/30/2008 Flu Vaccine Result:  given Flu Vaccine Next Due:  1 yr Pneumovax Result Date:  01/30/2008 Pneumovax Result:  given Pneumovax Next Due:  Not Indicated Colonoscopy Next Due:  Not Indicated PSA Next Due:  Not Indicated

## 2010-04-13 ENCOUNTER — Telehealth: Payer: Self-pay | Admitting: Family Medicine

## 2010-04-18 NOTE — Progress Notes (Signed)
Summary: needs referral to vein clinic  Phone Note Call from Patient Call back at Home Phone [phone]   Caller: Spouse Summary of Call: Pt wants referral to go to to [facility], Dr. Earnestine Leys. He was told at his last office visit to follow up there.   He was previously a pt there, but they have told him he would need a referral. Initial call taken by: Lowella Petties CMA, AAMA,  April 13, 2010 5:21 PM  Follow-up for Phone Call        referral sent  Follow-up by: Kerby Nora MD,  April 14, 2010 4:57 PM

## 2010-04-18 NOTE — Progress Notes (Signed)
Summary: lyrica   Phone Note Refill Request Message from:  Fax from Pharmacy on April 10, 2010 9:52 AM  Refills Requested: Medication #1:  LYRICA 75 MG CAPS 3 by mouth daily if needed can take more [facility] [address] [phone].   Initial call taken by: Melody Comas,  April 10, 2010 9:53 AM  Follow-up for Phone Call        Rx called to pharmacy Follow-up by: Benny Lennert CMA (AAMA),  April 10, 2010 10:12 AM    New/Updated Medications: LYRICA 75 MG CAPS (PREGABALIN) 3 by mouth daily Prescriptions: LYRICA 75 MG CAPS (PREGABALIN) 3 by mouth daily  #90 x 5   Entered and Authorized by:   Kerby Nora MD   Signed by:   Kerby Nora MD on 04/10/2010   Method used:   Telephoned to ...       [facility] [phone]* (retail)       [address] [place], [place]  [address]       Ph: [phone] or [phone]       Fax: [phone]   RxID:   [id number]

## 2010-04-29 ENCOUNTER — Other Ambulatory Visit: Payer: Self-pay | Admitting: Family Medicine

## 2010-05-01 ENCOUNTER — Other Ambulatory Visit: Payer: Self-pay | Admitting: *Deleted

## 2010-05-01 MED ORDER — SITAGLIPTIN PHOS-METFORMIN HCL 50-500 MG PO TABS: ORAL_TABLET | ORAL | Status: DC

## 2010-05-15 ENCOUNTER — Encounter: Payer: Self-pay | Admitting: Family Medicine

## 2010-05-15 ENCOUNTER — Telehealth: Payer: Self-pay | Admitting: *Deleted

## 2010-05-15 ENCOUNTER — Ambulatory Visit (INDEPENDENT_AMBULATORY_CARE_PROVIDER_SITE_OTHER): Payer: Medicare Other | Admitting: Family Medicine

## 2010-05-15 ENCOUNTER — Ambulatory Visit: Payer: Medicare Other | Admitting: Family Medicine

## 2010-05-15 VITALS — BP 100/80 | HR 84 | Temp 97.5°F | Ht 75.0 in | Wt 200.8 lb

## 2010-05-15 DIAGNOSIS — R9389 Abnormal findings on diagnostic imaging of other specified body structures: Secondary | ICD-10-CM

## 2010-05-15 DIAGNOSIS — R918 Other nonspecific abnormal finding of lung field: Secondary | ICD-10-CM

## 2010-05-15 DIAGNOSIS — R0602 Shortness of breath: Secondary | ICD-10-CM

## 2010-05-15 DIAGNOSIS — M549 Dorsalgia, unspecified: Secondary | ICD-10-CM

## 2010-05-15 DIAGNOSIS — M546 Pain in thoracic spine: Secondary | ICD-10-CM

## 2010-05-15 DIAGNOSIS — Z136 Encounter for screening for cardiovascular disorders: Secondary | ICD-10-CM

## 2010-05-15 LAB — POCT URINALYSIS DIPSTICK
Bilirubin, UA: NEGATIVE
Blood, UA: NEGATIVE
Glucose, UA: NEGATIVE
Spec Grav, UA: 1.01

## 2010-05-15 NOTE — Progress Notes (Signed)
  Subjective:    Patient ID: Shawn Zamora, male    DOB: 06-08-1923, 77 y.o.   MRN: [id number]  HPI  77 year old male with history of CAD presents with left mid upper back pain 3 weeks ago There is a very specific spot that hurts the most.  Seems to come and go... Has had three episodes.. 1 week in between episodes. Last episode 3 days ago.Vincente Poli.  Woke him up at night with pain last night. No pain with movement of left shoulder. Deep breaths makes back hurt more. No exertional pain..but has not been moving around a lot.  Some odor in urine and darkness of color in last few months. No new meds.  Using heating pad, liniment, tylenol...helps minimally.  No known falls or injuries (except 4 months ago fell in parking lot)... No change in activiity except 1 week ago did lifts some mild back pain.  Wife notes in past MI...had back pain. Cardiologist.. Dr. Mariah Milling.... Seen in 03/2010 (Reviewed note)... Stable EKG. No symptoms requiring work up at that time. History of coronary artery disease, bypass surgery in 2001 with a vein graft to the LAD, vein graft to the ramus and vein graft to the PDA in October 2001, with catheterization for shortness of breath in January 2010 showing proximal LAD occlusion with left to left collaterals, 100% mid RCA disease, patent vein graft to the RCA, patent vein graft to the ramus with occluded vein graft to the LAD, ejection fraction 40%. Treating with med management.    Review of Systems  Constitutional: Negative for fatigue and unexpected weight change.  HENT: Positive for rhinorrhea and postnasal drip. Negative for ear pain and congestion.   Respiratory: Positive for cough. Negative for shortness of breath and wheezing.        [Mild occ cough, nonproductive Cardiovascular: Negative for palpitations and leg swelling.  Musculoskeletal: Negative for myalgias.       Objective:   Physical Exam  Constitutional: Vital signs are normal. He appears  well-developed and well-nourished.  HENT:  Head: Normocephalic.  Right Ear: Hearing normal.  Left Ear: Hearing normal.  Nose: Nose normal.  Mouth/Throat: Oropharynx is clear and moist and mucous membranes are normal.  Neck: Trachea normal. Carotid bruit is not present. No mass and no thyromegaly present.  Cardiovascular: Normal rate, regular rhythm and normal pulses.  Exam reveals distant heart sounds. Exam reveals no gallop and no friction rub.   Murmur heard.  Systolic murmur is present       No peripheral edema  Pulmonary/Chest: Effort normal and breath sounds normal. No respiratory distress.   He exhibits tenderness. Right breast exhibits no mass and no skin change. Left breast exhibits no mass and no skin change.    Abdominal: Soft. Normal appearance. Bowel sounds are increased. There is no tenderness.  Musculoskeletal:       Left shoulder: He exhibits no tenderness, no deformity and normal strength.       Thoracic back: He exhibits no tenderness and no bony tenderness.       Lumbar back: He exhibits no tenderness and no bony tenderness.  Skin: Skin is warm, dry and intact. No rash noted. Rash is not vesicular.       No skin hypersensitivity suggesting zoster  Psychiatric: He has a normal mood and affect. His speech is normal and behavior is normal. Thought content normal.          Assessment & Plan:

## 2010-05-15 NOTE — Assessment & Plan Note (Addendum)
Given CAD... And past atypical presentation..EKG performed EKG showed no change from previous 11/2009 performed at cards office (cannot find 03/2010 EKG in EMR)... Old inferior MI. NSR.  Urine shows no suggestion of kidney infection and presentation of this would be very atypical.  I recommend evaluating further for rib fracture given focal area of pain and age.. As well as lung eval although lung exam is clear. He does have some mild cough.  No change in SOB.. So PE unlikely.

## 2010-05-15 NOTE — Patient Instructions (Addendum)
Stop at front desk to schedule returning for CXR TOMMOROW. We will call you with lab/X-ray results. Go to ER if severe pain, chest pain or shortness of breath.

## 2010-05-15 NOTE — Telephone Encounter (Signed)
Form for diabetic supplies is on your desk, I checked with the pt and he does want these supplies

## 2010-05-16 ENCOUNTER — Ambulatory Visit (INDEPENDENT_AMBULATORY_CARE_PROVIDER_SITE_OTHER)
Admission: RE | Admit: 2010-05-16 | Discharge: 2010-05-16 | Disposition: A | Discharge status: Home / Self Care | Payer: Medicare Other | Source: Ambulatory Visit | Attending: Family Medicine | Admitting: Family Medicine

## 2010-05-16 ENCOUNTER — Other Ambulatory Visit: Payer: Medicare Other

## 2010-05-16 DIAGNOSIS — M546 Pain in thoracic spine: Secondary | ICD-10-CM

## 2010-05-16 DIAGNOSIS — R9389 Abnormal findings on diagnostic imaging of other specified body structures: Secondary | ICD-10-CM

## 2010-05-16 DIAGNOSIS — R918 Other nonspecific abnormal finding of lung field: Secondary | ICD-10-CM

## 2010-05-16 DIAGNOSIS — R0602 Shortness of breath: Secondary | ICD-10-CM

## 2010-05-16 MED ORDER — IOHEXOL 300 MG/ML  SOLN
80.0000 mL | Freq: Once | INTRAMUSCULAR | Status: AC | PRN
  Administered 2010-05-16: 80 mL via INTRAVENOUS

## 2010-05-16 NOTE — Progress Notes (Signed)
Addended byKerby Nora on: 05/16/2010 11:17 AM   Modules accepted: Orders

## 2010-05-16 NOTE — Progress Notes (Signed)
  Subjective:    Patient ID: Shawn Zamora, male    DOB: [birth year], 77 y.o.   MRN: [id number]  HPI  CXR shows effusion moderate size on right....discussed with pt and wide... Will try to get CT scan with contrast ASAP.   Review of Systems     Objective:   Physical Exam        Assessment & Plan:

## 2010-05-16 NOTE — Progress Notes (Signed)
Addended byMills Koller on: 05/16/2010 11:42 AM   Modules accepted: Orders

## 2010-05-31 ENCOUNTER — Other Ambulatory Visit: Payer: Self-pay | Admitting: *Deleted

## 2010-05-31 MED ORDER — PREGABALIN 75 MG PO CAPS: 75.0000 mg | ORAL_CAPSULE | Freq: Three times a day (TID) | ORAL | Status: DC

## 2010-06-05 ENCOUNTER — Other Ambulatory Visit: Payer: Self-pay | Admitting: *Deleted

## 2010-06-05 MED ORDER — VALACYCLOVIR HCL 500 MG PO TABS: 500.0000 mg | ORAL_TABLET | Freq: Every day | ORAL | Status: DC

## 2010-06-07 ENCOUNTER — Telehealth: Payer: Self-pay | Admitting: *Deleted

## 2010-06-07 NOTE — Telephone Encounter (Signed)
Prior Berkley Harvey is needed for valtrex, form is on your desk.

## 2010-06-13 NOTE — Cardiovascular Report (Signed)
NAME:  Shawn Zamora, Shawn Zamora NO.:  [id number]   MEDICAL RECORD NO.:  [id number]          PATIENT TYPE:  OIB   LOCATION:  1965                         FACILITY:  [facility]   PHYSICIAN:  Elmore Guise., M.D.DATE OF BIRTH:  31-Aug-1923   DATE OF PROCEDURE:  02/19/2008  DATE OF DISCHARGE:  02/19/2008                            CARDIAC CATHETERIZATION   INDICATIONS FOR PROCEDURE:  Abnormal stress test, history of coronary  artery disease, now with progressive dyspnea.   DESCRIPTION OF PROCEDURE:  The patient was brought to the Cardiac Cath  Lab.  After appropriate informed consent, he was prepped and draped in a  sterile fashion.  Approximately 10 mL of 1% lidocaine was used for local  anesthesia.  A 4-French sheath was placed in the right femoral artery  without difficulty.  Coronary angiography, LV angiography, vein graft  angiography, and aortic root angiography were performed.  The patient  tolerated the procedure well with no apparent complications.  Total  contrast used during the case was 125 mL.   FINDINGS:  1. Left Main:  Mild tapering.  2. LAD:  100% proximal occlusion.  Fills late with left to left      collaterals.  3. LCX:  Small caliber vessel with mild-to-moderate luminal      irregularities.  4. OM-1:  Patent with mild-to-moderate luminal irregularities.  5. RCA:  60% proximal stenosis with a 100% mid occlusion.  6. Vein graft to distal RCA is patent with good distal flow.  PDA has      mild-to-moderate diffuse disease after insertion of his vein graft.  7. Vein graft to ramus intermedius is patent with good distal flow.      There are faint collaterals seen filling the LAD.  8. Vein graft to LAD is proximally occluded.  9. LV:  EF is 40% with mild inferior wall hypokinesis.  LVEDP is 21      mmHg.  10.Aortic root angiogram was performed showing normal-appearing aortic      root with no further vein graft visualized.   IMPRESSION:  1. Severe  obstructive native vessel disease.  2. Occluded vein graft to the left anterior descending.  3. Patent vein graft to the right coronary artery and ramus      intermedius  4. Mild left ventricular dysfunction, ejection fraction of 40%.   PLAN:  1. At this time, we will add Plavix to his regimen.  We will continue      aggressive medical therapy as listed.  From his native vessels as      well as bypass, no indication for pursuing intervention or redo      CABG needed at this time.      Elmore Guise., M.D.  Electronically Signed     TWK/MEDQ  D:  02/19/2008  T:  02/20/2008  Job:  [id number]

## 2010-06-16 NOTE — Discharge Summary (Signed)
[place]. [facility]  Patient:    Shawn Zamora, Shawn Zamora                MRN: [id number] Adm. Date:  [id number] Disc. Date: [id number] Attending:  Mikey Bussing Dictator:   Adair Patter, P.A.                           Discharge Summary  DATE OF BIRTH:  02/25/1923  ADMISSION DIAGNOSES: 1. Coronary artery disease. 2. Inferior myocardial infarction.  DISCHARGE DIAGNOSES: 1. Coronary artery disease. 2. Status post coronary artery bypass graft.  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  Shawn Zamora initially presented on October 20 with new onset chest pain at which time his wife took him to the emergency room where he was found to have acute myocardial infarction found by cardiac enzymes.  He underwent cardiac catheterization November 20, 1999, which revealed complex three-vessel coronary artery disease and ejection fraction of 40%. DD:  12/05/99 TD:  12/06/99 Job: [id number]

## 2010-06-16 NOTE — Op Note (Signed)
[place]. [facility]  Patient:    Shawn Zamora, Shawn Zamora                MRN: [id number] Proc. Date: 11/29/99 Adm. Date:  [id number] Attending:  Mikey Bussing CC:         CVTS office  Dr. Darrold Junker, [facility] in [place]   Operative Report  PREOPERATIVE DIAGNOSIS:  Class 4 post-infarction unstable angina with severe three vessel coronary artery disease, status post recent diaphragmatic myocardial infarct.  POSTOPERATIVE DIAGNOSIS:  Class 4 post-infarction unstable angina with severe three vessel coronary artery disease, status post recent diaphragmatic myocardial infarct.  OPERATION:  Coronary artery bypass grafting x 3 (saphenous vein graft to LAD, saphenous vein graft to ramus intermediate, and saphenous vein graft to posterior descending).  SURGEON:  Mikey Bussing, M.D.  ASSISTANT:  Maxwell Marion, RNFA.  ANESTHESIA:  General.  INDICATIONS:  The patient is a 77 year old white male who presented to [facility] with acute chest pain and ruled in for MI with a CPKMB of 280.  He was treated with _______ and heparin and nitroglycerin.  He underwent catheterization by Dr. Darrold Junker which demonstrated severe three vessel coronary disease with ejection fraction of 40%.  He was referred for surgical revascularization.  Prior to the operation, I examined the patient in his hospital room and reviewed the results of his cardiac catheterization with the patient and wife. I discussed the indications and expected benefits of coronary bypass grafting. The discussed the major aspects of the operation including the choice of conduit, the use of cardiopulmonary bypass and general anesthesia, the locations of surgical incisions, and the expected hospital recovery.  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  He understood the  alternatives of surgical therapy for his coronary disease, as well as the risks involved of MI, CVA, bleeding, infection, and death.  All questions were answered and the patient agreed to proceed with the operation as planned under informed consent.  OPERATIVE FINDINGS:  The saphenous vein was of good quality.  The mammary artery was dissected and harvested, but had suboptimal flow and was significantly smaller than the vein, so it was not used.  The distal circumflex was not graftable.  There was a recent inferior wall MI which was mild to moderate in severity.  DESCRIPTION OF PROCEDURE:  The patient was brought to the operating room and placed supine on the operating room table where general anesthesia was induced under invasive hemodynamic monitoring.  The chest, abdomen, and legs were prepped with Betadine, and draped as a sterile field.  A median sternotomy was performed as the saphenous vein was harvested from the right leg.  The left internal mammary artery was harvested as a pedicle graft from its origin of the subclavian veins, but was with suboptimal flow, and was not an adequate vessel to use for grafting.  Heparin was administered and the ACT was documented was being therapeutic.  The aprotinin protocol was used for this operation.  The pursestring was placed in the ascending aorta and right atrium.  The patient was placed on bypass and cooled to 28 degrees.  The coronaries were identified and the vein grafts were prepared for the distal anastomoses.  A cardioplegia cannula was placed and as the aortic cross-clamp was applied, 500 cc of cold blood cardioplegia was delivered to the aortic root with immediate cardioplegic arrest  and the septal temperature dropping less than 12 degrees.  Topical ice saline slush was used to augment myocardial preservation and a pericardial insufflator pad used to protect the left phrenic nerve.  The distal coronary anastomoses were then  performed.  The first distal anastomosis was to the distal LAD.  This is a 2.0 mm vessel with proximal 80% stenosis and a reverse saphenous vein was sewn end-to-side with a running 7-0 Prolene.  The second distal anastomosis was to the ramus intermediate which bifurcated into a medial and lateral branch.  The larger medial branch was used to construct an end-to-side anastomosis with a reverse saphenous vein using running 7-0 Prolene and cardioplegia was redosed.  The third distal anastomosis was to the posterior descending which was a 1.8 mm vessel with proximal 95% stenosis.  A reverse saphenous vein was sewn end-to-side with a running 7-0 Prolene with good flow through the graft.  The cross-clamp was then removed and the heart was cardioverted back to a regular rhythm.  Using a partial occluding clamp, ______ proximal vein anastomoses were performed using a 4.0 mm punch and running 6-0 Prolene.  The partial clamp was removed and the vein grafts were perfused.  Each had excellent flow and hemostasis was documented at the proximal and distal sites. The patient was rewarmed to 37 degrees and temporary pacing wires were applied.  The lungs were re-expanded and the ventilator was turned back on and the patient weaned from cardiopulmonary bypass on low dose dopamine with excellent hemodynamics and stable blood pressure.  Protamine was administered and the cannulas were removed.  The mediastinum was irrigated with warm antibiotic irrigation.  The leg incision was irrigated and closed in a standard fashion.  The superior pericardium was closed over the aortic vein grafts, and two mediastinal and a left pleural chest tube were placed, and brought out through separate incisions.  The sternum was reapproximated with interrupted steel wire and _______ fascia.  The subcutaneous layers and skin were closed with a running Vicryl.  Total cardiopulmonary bypass time was 100 minutes with aortic  cross-clamp time  of 30 minutes.  The hematocrit at the end of the operation was 34%.  No blood products were given. DD:  11/29/99 TD:  11/29/99 Job: [id number]

## 2010-06-16 NOTE — H&P (Signed)
[place]. [facility]  Patient:    Shawn Zamora, Shawn Zamora                MRN: [id number] Adm. Date:  [id number] Attending:  Mikey Bussing Dictator:   Lissa Merlin, P.A.C. CC:         Marcina Millard, M.D.  Bethann Punches, M.D.   History and Physical  DATE OF BIRTH:  October 18, 1997.  CARDIOLOGIST:  Marcina Millard, M.D.  PRIMARY CARE PHYSICIAN:  Bethann Punches, M.D.  CHIEF COMPLAINT:  Status post acute inferior myocardial infarction on November 18, 1999.  HISTORY OF PRESENT ILLNESS:  This is a 77 year old white male Jehovahs Witness with no previous history of coronary artery disease who is transferred from [facility] today, November 27, 1999 status post hospitalization from October 20 to November 27, 1999 for acute inferior MI.  Shawn Zamora is a healthy, active 77 year old male who initially presented on November 18, 1999 with new onset chest pain.  His wife drove him to the emergency room after having pain.  He was ruled in for acute inferior MI, via cardiac enzymes and EKG changes.  He was stabilized on Lovenox, nitroglycerin, beta blocker and Plavix.  He was treated with TNK.  He was catheterized on November 20, 1999. which revealed complex three vessel coronary disease with mildly decreased left ventricular function and EF of 40%.  He has been stable, pain-free and comfortable for several days.  PAST MEDICAL HISTORY: 1. Hypertension. 2  Depression. 3. Insomnia. 4. GERD.  FAMILY HISTORY:  The patient gives a vague family history.  Father deceased age 36s secondary to brain tumor.  Mother deceased age 53 unknown cause. Also has brother deceased secondary to brain tumor and sister in her 97s alive and well.  SOCIAL HISTORY:  He is married with a supportive wife.  He is a remote smoker, smoking one pack per week for two years 50 years ago.  He admits to very occasional ETOH, around one pint per year.  He is retired.  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  He is a Curator.  PAST SURGICAL HISTORY:  Tonsillectomy.  REVIEW OF SYSTEMS:  He says he is a "borderline diabetic." This is supported by his blood glucose levels at [facility].  He also reports increasing dyspnea on exertion over the last few months, while working in the yard.  He has macular degeneration.  He wears glasses.  He has hearing aids in both ears.  He does not wear dentures.  He has had a recent right elbow injury last June with an open fracture after a fall.  He denies paroxysmal nocturnal dyspnea.  He denies orthopnea.  He denies CVA, prostate problems, GI, or GU problem, liver or thyroid problems, cancer, bleeding problems, shortness of breath, asthma, emphysema and pneumonia.  LABORATORY DATA:  November 26, 1999 - Hemoglobin 14.3, hematocrit 41.9. November 20, 1999 platelets 198,000.  Sodium 137, potassium 3.8, chloride 104, CO2 28, BUN 11, creatinine 1.2.  PHYSICAL EXAMINATION:  VITAL SIGNS:  Temperature 97.8, blood pressure 122/74, pulse 84 and regular sinus rhythm on telemetry, respirations 18.  Height 6 feet 2 inches, weight 200.5 pounds.  GENERAL:  A well-nourished, well-developed pleasant white male, supine in no acute distress, appears younger than stated age.  HEENT:  Normocephalic, atraumatic.  Pupils are equal, round and reactive  to light.  Visual acuity intact.  Extraocular movements intact OU.  Oropharynx is grossly normal with pink moist mucosa.  NECK:  Supple, no thyromegaly or adenopathy.  There are 2+ carotids with no bruits.  There is no JVD.  CHEST:  Symmetrical, clear to auscultation and percussion with nonlabored breath sounds.  He has a prominent xiphoid.  CARDIAC:  Regular rate and rhythm with S1, S2, no murmur.  ABDOMEN:  Soft, nontender, nondistended with positive bowel sounds.  No palpable masses.  There are no  abdominal bruits.  EXTREMITIES:  Warm with no edema.  No signs of venous insufficiency.  There is an old scar over the left tibial area secondary to old World War II wound.  VASCULAR:  He has 2+ pulses in his upper extremities bilaterally.  He has 2+ femoral pulses.  He has 2+ popliteal pulses, 2+ dorsalis pedis and posterior tibial pulses bilaterally.  NEUROLOGICAL:  Cranial nerves 2-12 are grossly normal.  There are no focal deficits.  He has normal sensation throughout.  He moves all extremities equally.  ASSESSMENT AND PLAN:  This is a 77 year old white male with newly diagnosed coronary artery disease status post acute inferior wall myocardial infarction, transferred to [facility] for surgical revascularization by Dr. Donata Clay. Shawn Zamora is currently stable and comfortable on Unit [address].  Routine labs and tests will be obtained.  He is scheduled for coronary artery bypass grafting on Wednesday, November 29, 1999. DD:  11/27/99 TD:  11/27/99 Job: [id number]

## 2010-06-16 NOTE — Discharge Summary (Signed)
[place]. [facility]  Patient:    Shawn Zamora, Shawn Zamora                MRN: [id number] Adm. Date:  [id number] Disc. Date: [id number] Attending:  Mikey Bussing Dictator:   Adair Patter, P.A.C. CC:         Mikey Bussing, M.D.  Dr. Cephas Darby   Discharge Summary  DATE OF BIRTH:  1923-10-23.  ADMISSION DIAGNOSES: 1. Coronary artery disease. 2. Inferior myocardial infarction.  DISCHARGE DIAGNOSES: 1. Coronary artery disease. 2. Status post coronary artery bypass graft.  ADMISSION HISTORY AND PHYSICAL:  This is a 77 year old white male with no previous history of coronary artery disease who was transferred from [facility] after being hospitalized for acute inferior myocardial infarction.  Shawn Zamora initially presented with new onset chest pain.  He was taken to the emergency room where he was ruled in for acute inferior MI by cardiac enzyme changes.  Because of this, he was catheterized which revealed complex three vessel coronary artery disease and ejection fraction of 40%.  PHYSICAL EXAMINATION:  VITAL SIGNS:  Blood pressure 122/74, pulse 84 and regular, respiratory rate 18.  HEENT:  Within normal limits.  NECK:  Supple without thyromegaly or lymphadenopathy and there were no carotid bruits.  LUNGS:  Clear to auscultation bilaterally.  CARDIOVASCULAR:  Regular rate and rhythm without murmurs, rubs, or gallops.  ABDOMEN:  Soft, nontender and nondistended with positive bowel sounds in all four quadrants.  EXTREMITIES:  No clubbing, cyanosis, or edema.  NEUROLOGICAL:  Grossly intact.  HOSPITAL COURSE:  On November 29, 1999, the patient underwent coronary artery bypass graft x 3 with saphenous vein graft to the left anterior descending, saphenous vein graft to the ramus and a saphenous vein graft to the posterior descending coronary artery.  The procedure was performed by Mikey Bussing,  M.D., under general endotracheal anesthesia with cardiopulmonary bypass. No complications were noted during the procedure and the patient was taken to the SICU in stable condition.  The patient was later extubated on postoperative day.  On postoperative day #1, the patients hematocrit was 32 and his creatinine was 0.9.  These values remained stable throughout the remainder of his hospital stay.  On postoperative day #1 the patients mediastinal tubes were discontinued and the patient was transferred to the transitional care unit.  On post day #2, the patient remained stable and in normal sinus rhythm.  However, he was requiring oxygen therapy.  On postoperative day #3, the patient was again stable but still requiring oxygen therapy.  Aggressive pulmonary toilet was undertaken and the patient was started on p.o. Tequin.  On postoperative day #4 and #5 the patient remained stable, slowly being weaned off oxygen.  He was transferred to the step-down unit on postoperative day #4.  By postoperative day #6 the patient was mobilizing well, not requiring any oxygen therapy and tolerating all nourishment orally.  Discharge planning was made for postoperative day #7, December 06, 1999.  DISCHARGE MEDICATIONS: 1. Darvocet-N 100 one to two tablets every four to six hours as needed    for pain. 2. Ferrous gluconate 300 mg one tablet daily. 3. Aspirin 325 mg one tablet daily. 4. Pepcid 20 mg one tablet daily. 5. Tequin 400 mg one tablet daily for five days. 6. Atenolol 25 mg one half tablet every 12 hours. 7. Folic acid 1 mg one tablet daily. 8. Wellbutrin 150 mg one tablet  twice daily.  DISCHARGE ACTIVITY:  The patient was told to avoid any strenuous activity, driving, lifting objects over 10 pounds.  DISCHARGE DIET:  An 1800 calorie ADA diet.  WOUND CARE:  The patient was told he may take a shower and clean wounds with mild soap and water.  DISPOSITION:  The patient was discharged to  home.  DISCHARGE FOLLOW-UP: The patient was instructed to follow up with  _________ center at [facility] for counseling.  He was given the phone number for this center to call.  The patient was also instructed to follow up with his cardiologist, Dr. Cephas Darby.  He was told to call the doctors office in two weeks for an appointment.  He was instructed for this doctor to take chest x-ray and should take the chest x-ray to Dr. Vincent Gros office.  The patient was also instructed to follow up with Dr. Donata Clay on Friday, December 29, 1999, at 2:15 p.m. DD:  12/05/99 TD:  12/06/99 Job: [id number]

## 2010-06-20 ENCOUNTER — Encounter: Payer: Self-pay | Admitting: Family Medicine

## 2010-06-20 ENCOUNTER — Telehealth: Payer: Self-pay | Admitting: *Deleted

## 2010-06-20 NOTE — Telephone Encounter (Signed)
Prior auth form for valtrex is on your desk.  I think this may have been done but I dont know what happened to the forms.

## 2010-06-21 NOTE — Telephone Encounter (Signed)
Disregard, approval is in place.

## 2010-06-21 NOTE — Telephone Encounter (Signed)
Prior auth given for valtrex, advised pt's wife.  [employer] said the valtrex is covered, pt may have been trying to get refill too soon.  Approval letter placed on doctor's desk for signature and scanning.

## 2010-06-23 ENCOUNTER — Other Ambulatory Visit: Payer: Self-pay | Admitting: Family Medicine

## 2010-06-23 DIAGNOSIS — E7800 Pure hypercholesterolemia, unspecified: Secondary | ICD-10-CM

## 2010-06-27 ENCOUNTER — Other Ambulatory Visit (INDEPENDENT_AMBULATORY_CARE_PROVIDER_SITE_OTHER): Payer: Medicare Other | Admitting: Family Medicine

## 2010-06-27 DIAGNOSIS — E7800 Pure hypercholesterolemia, unspecified: Secondary | ICD-10-CM

## 2010-06-27 DIAGNOSIS — E119 Type 2 diabetes mellitus without complications: Secondary | ICD-10-CM

## 2010-06-27 LAB — HEPATIC FUNCTION PANEL
ALT: 18 U/L (ref 0–53)
AST: 14 U/L (ref 0–37)
Albumin: 4.1 g/dL (ref 3.5–5.2)
Alkaline Phosphatase: 51 U/L (ref 39–117)
Total Protein: 6.8 g/dL (ref 6.0–8.3)

## 2010-06-27 LAB — BASIC METABOLIC PANEL
CO2: 30 mEq/L (ref 19–32)
Calcium: 9.4 mg/dL (ref 8.4–10.5)
Chloride: 103 mEq/L (ref 96–112)
Glucose, Bld: 197 mg/dL — ABNORMAL HIGH (ref 70–99)
Sodium: 140 mEq/L (ref 135–145)

## 2010-06-27 LAB — MICROALBUMIN / CREATININE URINE RATIO
Creatinine,U: 118.9 mg/dL
Microalb Creat Ratio: 0.4 mg/g (ref 0.0–30.0)
Microalb, Ur: 0.5 mg/dL (ref 0.0–1.9)

## 2010-06-27 LAB — LIPID PANEL: Total CHOL/HDL Ratio: 4

## 2010-06-27 LAB — HEMOGLOBIN A1C: Hgb A1c MFr Bld: 6.8 % — ABNORMAL HIGH (ref 4.6–6.5)

## 2010-06-29 ENCOUNTER — Encounter: Payer: Self-pay | Admitting: Cardiovascular Disease

## 2010-06-29 ENCOUNTER — Ambulatory Visit (INDEPENDENT_AMBULATORY_CARE_PROVIDER_SITE_OTHER): Payer: Medicare Other | Admitting: Cardiovascular Disease

## 2010-06-29 DIAGNOSIS — I25810 Atherosclerosis of coronary artery bypass graft(s) without angina pectoris: Secondary | ICD-10-CM

## 2010-06-29 DIAGNOSIS — F068 Other specified mental disorders due to known physiological condition: Secondary | ICD-10-CM

## 2010-06-29 DIAGNOSIS — E785 Hyperlipidemia, unspecified: Secondary | ICD-10-CM

## 2010-06-29 DIAGNOSIS — R269 Unspecified abnormalities of gait and mobility: Secondary | ICD-10-CM

## 2010-06-29 DIAGNOSIS — E119 Type 2 diabetes mellitus without complications: Secondary | ICD-10-CM

## 2010-06-29 MED ORDER — CLOPIDOGREL BISULFATE 75 MG PO TABS: 75.0000 mg | ORAL_TABLET | Freq: Every day | ORAL | Status: DC

## 2010-06-29 NOTE — Assessment & Plan Note (Signed)
His legs are relatively weak. We have encouraged him to join the [facility] if they qualify for the Silver sne[employer] program. He would need regular physical therapy to maintain his leg strength.  His blood pressure is borderline low and we will hold his ramapril for now. Have asked him to monitor his blood pressure at home

## 2010-06-29 NOTE — Assessment & Plan Note (Addendum)
Currently with no symptoms of angina. No further workup at this time. We will increase his Plavix to daily now that it is generic and he has had no further nosebleeds.

## 2010-06-29 NOTE — Assessment & Plan Note (Signed)
We have encouraged continued exercise, careful diet management   

## 2010-06-29 NOTE — Assessment & Plan Note (Signed)
His wife reports that he has mild dementia. Aricept was started 5 years ago. They are wondering if they can stop the medication secondary to cost. I suggested they talk with Dr. Ermalene Searing.

## 2010-06-29 NOTE — Progress Notes (Signed)
   Patient ID: Shawn Zamora, male    DOB: [birth year], 77 y.o.   MRN: [id number]  HPI Comments: Shawn Zamora is a very pleasant 77 year old gentleman with a history of coronary artery disease, bypass surgery in 2001 with a vein graft to the LAD, vein graft to the ramus and vein graft to the PDA in October 2001, with catheterization for shortness of breath in January 2010 showing proximal LAD occlusion with left to left collaterals, 100% mid RCA disease, patent vein graft to the RCA, patent vein graft to the ramus with occluded vein graft to the LAD, ejection fraction 40%. He presents for routine followup.   He reports that his balance has been poor. His wife describes that he is weak, falls backwards frequently. He has not participated in physical therapy at [facility] anymore as this is too expensive. He denies any shortness of breath or chest discomfort. He has noticed that his blood pressure is low at home and his sugars have been low at home.  He reports having the flu or a severe upper respiratory infection for the past month. It is slowly getting better. He has had to take significant amounts of cold medicines.   Old EKG shows normal sinus rhythm with rate 73 beats per minute, old inferior infarct, no other significant ST or T wave changes Labs from February of this year showed total cholesterol 160, LDL 86, HDL 39, normal LFTs        Review of Systems  Constitutional: Positive for fatigue.  HENT: Negative.   Eyes: Negative.   Respiratory: Positive for cough.   Cardiovascular: Negative.   Gastrointestinal: Negative.   Musculoskeletal: Positive for gait problem.  Skin: Negative.   Neurological: Positive for dizziness and weakness.  Hematological: Negative.   Psychiatric/Behavioral: Negative.   All other systems reviewed and are negative.    BP 100/62  Pulse 76  Ht 6\' 2"  (1.88 m)  Wt 202 lb (91.627 kg)  BMI 25.94 kg/m2   Physical Exam  Nursing note and vitals  reviewed. Constitutional: He is oriented to person, place, and time. He appears well-developed and well-nourished.  HENT:  Head: Normocephalic.  Nose: Nose normal.  Mouth/Throat: Oropharynx is clear and moist.  Eyes: Conjunctivae are normal. Pupils are equal, round, and reactive to light.  Neck: Normal range of motion. Neck supple. No JVD present.  Cardiovascular: Normal rate, regular rhythm, S1 normal, S2 normal, normal heart sounds and intact distal pulses.  Exam reveals no gallop and no friction rub.   No murmur heard. Pulmonary/Chest: Effort normal and breath sounds normal. No respiratory distress. He has no wheezes. He has no rales. He exhibits no tenderness.  Abdominal: Soft. Bowel sounds are normal. He exhibits no distension. There is no tenderness.  Musculoskeletal: Normal range of motion. He exhibits no edema and no tenderness.  Lymphadenopathy:    He has no cervical adenopathy.  Neurological: He is alert and oriented to person, place, and time. Coordination normal.  Skin: Skin is warm and dry. No rash noted. No erythema.  Psychiatric: He has a normal mood and affect. His behavior is normal. Judgment and thought content normal.           Assessment and Plan

## 2010-06-29 NOTE — Assessment & Plan Note (Addendum)
Cholesterol is slightly above goal. Ideally goal LDL should  be less than 70. He has had mild weight loss with his recent flu. We'll keep his medicines as they are currently for now. Next change would be Lipitor 80 mg. I be more concerned about muscle issues and weakness would be much higher dose.

## 2010-06-29 NOTE — Patient Instructions (Signed)
You are doing well. Keep walking, try the silver sneakers HOLD THE RAMAPRIL as your blood pressure is low.  Increase the plavix to daily Please call us if you have new issues that need to be addressed before your next appt.  We will call you for a follow up Appt. In 6 months

## 2010-07-04 ENCOUNTER — Encounter: Payer: Self-pay | Admitting: Family Medicine

## 2010-07-04 ENCOUNTER — Ambulatory Visit (INDEPENDENT_AMBULATORY_CARE_PROVIDER_SITE_OTHER): Payer: Medicare Other | Admitting: Family Medicine

## 2010-07-04 DIAGNOSIS — E785 Hyperlipidemia, unspecified: Secondary | ICD-10-CM

## 2010-07-04 DIAGNOSIS — E119 Type 2 diabetes mellitus without complications: Secondary | ICD-10-CM

## 2010-07-04 DIAGNOSIS — F068 Other specified mental disorders due to known physiological condition: Secondary | ICD-10-CM

## 2010-07-04 DIAGNOSIS — I25810 Atherosclerosis of coronary artery bypass graft(s) without angina pectoris: Secondary | ICD-10-CM

## 2010-07-04 DIAGNOSIS — M546 Pain in thoracic spine: Secondary | ICD-10-CM

## 2010-07-04 DIAGNOSIS — F341 Dysthymic disorder: Secondary | ICD-10-CM

## 2010-07-04 NOTE — Assessment & Plan Note (Signed)
No further progression on aricept. Will continue.

## 2010-07-04 NOTE — Assessment & Plan Note (Signed)
Well controlled. Will try to stop imipramine to simplify regimen.

## 2010-07-04 NOTE — Assessment & Plan Note (Addendum)
Cholesterol nowt at goal <70! Continue current meds.

## 2010-07-04 NOTE — Assessment & Plan Note (Signed)
Well controlled. Continue current medication.  

## 2010-07-04 NOTE — Progress Notes (Signed)
  Subjective:    Patient ID: Shawn Zamora, male    DOB: [birth year], 77 y.o.   MRN: [id number]  HPI 77 year  Old male with CAD, DM, high chol and dementia [presents for follow up.  Dementia, on aricept for 5 years with minimal improvement.  Wife and patient has decided to  To continue this medication to avoid further loss of memory.  Not further loss of memory in last 5 years.  Depression, well controlled on sertraline. No problems sleeping so they are interested in stopping this medication Fells tired the next day.  Elevated Cholesterol:Now at goal <70. Using medications without problems: Yes. Muscle aches: None, some leg weakness but better off ramipril.  Hypertension:  Well controlled off ramipril, still on lopressor Using medication without problems or lightheadedness:  Chest pain with exertion: None Edema:None Short of breath:None Average home BPs: 110/70     Diabetes: Well controlled on glimperide and janumet. Improved from last check. Working on health eat. Lab Results  Component Value Date   HGBA1C 6.8* 06/27/2010   Using medications without difficulties: Yes Hypoglycemic episodes:None Hyperglycemic episodes:None Feet problems:None Blood Sugars averaging: 70-97-160 eye exam within last year:None     Review of Systems     Objective:   Physical Exam Diabetic foot exam: ? Fungal infection: Told  No skin breakdown No calluses  Normal DP pulses Nml sensation to light touch and diminished monofilament in toes Nails normal, removed on great toes.        Assessment & Plan:

## 2010-07-04 NOTE — Assessment & Plan Note (Signed)
Improved, only rarely having pain.

## 2010-07-04 NOTE — Assessment & Plan Note (Signed)
Reviewed last OV with Dr. Mariah Milling from 06/29/2010. No changes made. Pt assymptomatic.

## 2010-07-04 NOTE — Patient Instructions (Addendum)
Try stopping impramine, follow sleep habits. May use as needed as well. Start OTC fungal treatment for feet (clotrimazole). Continue working on exercise and low carbohydrate diet.

## 2010-07-13 ENCOUNTER — Other Ambulatory Visit: Payer: Self-pay | Admitting: *Deleted

## 2010-07-13 MED ORDER — METOPROLOL TARTRATE 25 MG PO TABS: 25.0000 mg | ORAL_TABLET | Freq: Two times a day (BID) | ORAL | Status: DC

## 2010-07-13 MED ORDER — ATORVASTATIN CALCIUM 40 MG PO TABS: 40.0000 mg | ORAL_TABLET | Freq: Every day | ORAL | Status: DC

## 2010-07-13 MED ORDER — SITAGLIPTIN PHOS-METFORMIN HCL 50-500 MG PO TABS: ORAL_TABLET | ORAL | Status: DC

## 2010-07-14 ENCOUNTER — Other Ambulatory Visit: Payer: Self-pay

## 2010-07-14 MED ORDER — CLOPIDOGREL BISULFATE 75 MG PO TABS: 75.0000 mg | ORAL_TABLET | Freq: Every day | ORAL | Status: DC

## 2010-07-14 NOTE — Telephone Encounter (Signed)
Dr. Mariah Milling increased his Plavix to 75 mg one tablet daily.

## 2010-07-28 ENCOUNTER — Other Ambulatory Visit: Payer: Self-pay | Admitting: *Deleted

## 2010-07-28 MED ORDER — GLIMEPIRIDE 4 MG PO TABS: 4.0000 mg | ORAL_TABLET | Freq: Two times a day (BID) | ORAL | Status: DC

## 2010-08-04 ENCOUNTER — Telehealth: Payer: Self-pay | Admitting: *Deleted

## 2010-08-04 NOTE — Telephone Encounter (Signed)
Caller states that Pt received medication today [Glimepiride 4mg ] from [facility] and the Rx was only prescribed for #90. Sig on med is  [1] tablet BID Caller request correction to [facility] for  #180 for 3-mth supply

## 2010-08-07 MED ORDER — GLIMEPIRIDE 4 MG PO TABS: 4.0000 mg | ORAL_TABLET | Freq: Two times a day (BID) | ORAL | Status: DC

## 2010-08-07 NOTE — Telephone Encounter (Signed)
Rx changed from 90 to 180 for 3 month supply

## 2010-08-17 ENCOUNTER — Other Ambulatory Visit: Payer: Self-pay | Admitting: *Deleted

## 2010-08-17 MED ORDER — DONEPEZIL HCL 5 MG PO TABS: 5.0000 mg | ORAL_TABLET | Freq: Every day | ORAL | Status: DC

## 2010-08-17 MED ORDER — SERTRALINE HCL 100 MG PO TABS: 100.0000 mg | ORAL_TABLET | Freq: Every day | ORAL | Status: DC

## 2010-08-17 NOTE — Telephone Encounter (Signed)
Medication resent e-scribe not working

## 2010-08-17 NOTE — Telephone Encounter (Signed)
Addended by: Consuello Masse on: 08/17/2010 11:13 AM   Modules accepted: Orders

## 2010-08-17 NOTE — Telephone Encounter (Signed)
Okay to refill, but ask opt fist if he wants to chenge to 100 mg tab dose of sertraline instead of continuing to take 2 tabs of 50? Go ahead and refill for 1 year.

## 2010-08-17 NOTE — Telephone Encounter (Signed)
Addended by: Consuello Masse on: 08/17/2010 11:41 AM   Modules accepted: Orders

## 2010-08-25 ENCOUNTER — Telehealth: Payer: Self-pay | Admitting: *Deleted

## 2010-08-25 NOTE — Telephone Encounter (Signed)
Form faxed

## 2010-08-25 NOTE — Telephone Encounter (Signed)
Prior auth is needed for aricept, form is on your desk. 

## 2010-09-20 ENCOUNTER — Encounter: Payer: Self-pay | Admitting: Family Medicine

## 2010-09-20 ENCOUNTER — Ambulatory Visit (INDEPENDENT_AMBULATORY_CARE_PROVIDER_SITE_OTHER): Payer: Medicare Other | Admitting: Family Medicine

## 2010-09-20 VITALS — BP 120/72 | HR 77 | Temp 97.3°F | Ht 74.0 in | Wt 209.1 lb

## 2010-09-20 DIAGNOSIS — G5700 Lesion of sciatic nerve, unspecified lower limb: Secondary | ICD-10-CM

## 2010-09-20 DIAGNOSIS — M545 Low back pain: Secondary | ICD-10-CM

## 2010-09-20 DIAGNOSIS — R5381 Other malaise: Secondary | ICD-10-CM

## 2010-09-20 DIAGNOSIS — S239XXA Sprain of unspecified parts of thorax, initial encounter: Secondary | ICD-10-CM

## 2010-09-20 DIAGNOSIS — M6250 Muscle wasting and atrophy, not elsewhere classified, unspecified site: Secondary | ICD-10-CM

## 2010-09-20 DIAGNOSIS — S238XXA Sprain of other specified parts of thorax, initial encounter: Secondary | ICD-10-CM

## 2010-09-20 MED ORDER — OMEPRAZOLE-SODIUM BICARBONATE 40-1100 MG PO CAPS: 1.0000 | ORAL_CAPSULE | Freq: Every day | ORAL | Status: DC

## 2010-09-20 MED ORDER — DONEPEZIL HCL 5 MG PO TABS: 5.0000 mg | ORAL_TABLET | Freq: Every day | ORAL | Status: DC

## 2010-09-20 NOTE — Progress Notes (Signed)
  Subjective:    Patient ID: Shawn Zamora, male    DOB: [birth year], 77 y.o.   MRN: [id number]  HPI  Shawn Zamora, a 77 y.o. male presents today in the office for the following:    The patient presents with a 6 month history of back pain on the LEFT side in the parascapular region as well as buttocks pain and some mild diffuse overall back pain it has been ongoing, and this is worsened somewhat over time. He did not have any plain films, but I reviewed the CT of his chest that he had. He does have some significant degenerative disc disease in the thoracic spine region and the portions of his spine that are included overall in this CT.  Overall, additionally an 39, he has become weak, is having a harder time rising from a seated position, having a hard time walking, and is deconditioned overall.  6 months ago - having pain in his back No improved - maybe worse.   The PMH, PSH, Social History, Family History, Medications, and allergies have been reviewed in [facility], and have been updated if relevant.  Review of Systems REVIEW OF SYSTEMS  GEN: No fevers, chills. Nontoxic. Primarily MSK c/o today. MSK: Detailed in the HPI GI: tolerating PO intake without difficulty Neuro: No numbness, parasthesias, or tingling associated. Otherwise the pertinent positives of the ROS are noted above.      Objective:   Physical Exam   Physical Exam  Blood pressure 120/72, pulse 77, temperature 97.3 F (36.3 C), temperature source Oral, height 6\' 2"  (1.88 m), weight 209 lb 1.9 oz (94.856 kg), SpO2 98.00%.  GEN: WDWN, NAD, Non-toxic, A & O x 3 HEENT: Atraumatic, Normocephalic. Neck supple. No masses, No LAD. Ears and Nose: No external deformity. EXTR: No c/c/e NEURO Normal gait.  PSYCH: Normally interactive. Conversant. Not depressed or anxious appearing.  Calm demeanor.   In the parascapular region, it pain isolates out of the rhomboids on the LEFT side, and he notes this is his area of  maximal tenderness. Minimal to no pain in the E. Rector spinae complex. Relatively limited in his ability to move at the waist including flexion and extension and rotation. No pain at the SI joints. LEFT, greater than RIGHT on his buttocks and hip abductor and piriformis region is in some significant spasm.  Hip flexion 4+/5 Hip abduction 4 minus/5      Assessment & Plan:   1. Sprain of rhomboid  Ambulatory referral to Physical Therapy  2. Low back pain  Ambulatory referral to Physical Therapy  3. Piriformis syndrome  Ambulatory referral to Physical Therapy  4. Physical deconditioning     Also DDD  Multifactorial back pain in 78 year old patient. ddid my best to explain the various pathophysiology with the patient and his wife. Very weak scapular stabilizers and pelvic stability. Putting all this is significantly influencing the pain that he is having.  Formal physical therapy for training for deconditioning, significantly weak scapular stabilizers and pelvic stability

## 2010-09-20 NOTE — Patient Instructions (Signed)
REFERRAL: GO THE THE FRONT ROOM AT THE ENTRANCE OF OUR CLINIC, NEAR CHECK IN. ASK FOR MARION. SHE WILL HELP YOU SET UP YOUR REFERRAL. DATE: TIME:  

## 2010-09-27 NOTE — Telephone Encounter (Signed)
Prior auth given for aricept, approval letter placed on doctor's desk for signature and scanning.

## 2010-09-28 ENCOUNTER — Other Ambulatory Visit: Payer: Medicare Other

## 2010-09-29 ENCOUNTER — Other Ambulatory Visit (INDEPENDENT_AMBULATORY_CARE_PROVIDER_SITE_OTHER): Payer: Medicare Other

## 2010-09-29 DIAGNOSIS — E119 Type 2 diabetes mellitus without complications: Secondary | ICD-10-CM

## 2010-10-05 ENCOUNTER — Ambulatory Visit (INDEPENDENT_AMBULATORY_CARE_PROVIDER_SITE_OTHER): Payer: Medicare Other | Admitting: Family Medicine

## 2010-10-05 ENCOUNTER — Encounter: Payer: Self-pay | Admitting: Family Medicine

## 2010-10-05 DIAGNOSIS — E785 Hyperlipidemia, unspecified: Secondary | ICD-10-CM

## 2010-10-05 DIAGNOSIS — R21 Rash and other nonspecific skin eruption: Secondary | ICD-10-CM

## 2010-10-05 DIAGNOSIS — F341 Dysthymic disorder: Secondary | ICD-10-CM

## 2010-10-05 DIAGNOSIS — E119 Type 2 diabetes mellitus without complications: Secondary | ICD-10-CM

## 2010-10-05 DIAGNOSIS — Z0000 Encounter for general adult medical examination without abnormal findings: Secondary | ICD-10-CM

## 2010-10-05 DIAGNOSIS — Z23 Encounter for immunization: Secondary | ICD-10-CM

## 2010-10-05 DIAGNOSIS — Z2911 Encounter for prophylactic immunotherapy for respiratory syncytial virus (RSV): Secondary | ICD-10-CM

## 2010-10-05 DIAGNOSIS — I25810 Atherosclerosis of coronary artery bypass graft(s) without angina pectoris: Secondary | ICD-10-CM

## 2010-10-05 MED ORDER — TRIAMCINOLONE ACETONIDE 0.5 % EX CREA: TOPICAL_CREAM | Freq: Three times a day (TID) | CUTANEOUS | Status: DC

## 2010-10-05 MED ORDER — IMIPRAMINE HCL 10 MG PO TABS: 10.0000 mg | ORAL_TABLET | Freq: Every day | ORAL | Status: DC

## 2010-10-05 NOTE — Progress Notes (Signed)
Subjective:    Patient ID: Shawn Zamora, male    DOB: 18-Oct-1923, 77 y.o.   MRN: [id number]  HPI   I have personally reviewed the Medicare Annual Wellness questionnaire and have noted 1. The patient's medical and social history 2. Their use of alcohol, tobacco or illicit drugs 3. Their current medications and supplements 4. The patient's functional ability including ADL's, fall risks, home safety risks and hearing or visual             impairment. 5. Diet and physical activities 6. Evidence for depression or mood disorders  The patients weight, height, BMI and visual acuity have been recorded in the chart I have made referrals, counseling and provided education to the patient based review of the above and I have provided the pt with a written personalized care plan for preventive services.  Diabetes:  On janumet, amaryl Lab Results  Component Value Date   HGBA1C 7.1* 09/29/2010  Using medications without difficulties: Hypoglycemic episodes:None Hyperglycemic episodes:None Feet problems:None Blood Sugars averaging: FBS 130 eye exam within last year: Has gained 12 lbs in last 6 months  Elevated Cholesterol: Lipitor  40 mg daily  Using medications without problems: None Muscle aches: None Other complaints:  He is feels like he is not urinating correctly. He gets up at night feels like he has to pee but nothing comes out or comes out slow. Sitting down.  He has daytime UOP 5 times a day, urine comes out well when he stands up. No burning with urination, hematuria.  Red dry rash on arms and legs...very itchy. Noted 2 moths ago.  no change with neosporin or cortisone. Works out in yard. Using lotion.   PT has helped back pain. Plans on continuing.  Not sleeping as well and moody off impramine.. WIll restart.   Good diet. Not exercising...but plans to restart. Dementia stable on aricept. Review of Systems  Constitutional: Negative for fever and fatigue.  HENT:  Negative for ear pain.   Eyes: Negative for pain.  Respiratory: Negative for shortness of breath and wheezing.   Cardiovascular: Negative for chest pain, palpitations and leg swelling.  Gastrointestinal: Negative for abdominal pain, diarrhea, constipation, blood in stool and abdominal distention.  Genitourinary: Negative for dysuria and hematuria.  Neurological: Negative for weakness and numbness.       Objective:   Physical Exam  Constitutional: Vital signs are normal. He appears well-developed and well-nourished.       Elderly male ambulatory in NAd  HENT:  Head: Normocephalic.  Right Ear: Hearing normal.  Left Ear: Hearing normal.  Nose: Nose normal.  Mouth/Throat: Oropharynx is clear and moist and mucous membranes are normal.  Neck: Trachea normal. Carotid bruit is not present. No mass and no thyromegaly present.  Cardiovascular: Normal rate, regular rhythm and normal pulses.  Exam reveals no gallop, no distant heart sounds and no friction rub.   No murmur heard.      No peripheral edema  Pulmonary/Chest: Effort normal and breath sounds normal. No respiratory distress.  Skin: Skin is warm, dry and intact. Rash noted.       Dry flaky skin with erythematous papules scatterred or in patches on arms and legs  Psychiatric: He has a normal mood and affect. His speech is normal and behavior is normal. Thought content normal.     Diabetic foot exam: Normal inspection No skin breakdown No calluses  Normal DP pulses Normal sensation to light touch and monofilament Nails normal  Assessment & Plan:

## 2010-10-05 NOTE — Patient Instructions (Addendum)
Use moisturizing cream instead of lotion. Apply  topical steroid lotion.  Get back on track with exercise.  Continue to work on [employer]. Trial of lower dose imipramine for sleep and mood, trying to avoid constipation.

## 2010-10-20 DIAGNOSIS — Z0000 Encounter for general adult medical examination without abnormal findings: Secondary | ICD-10-CM | POA: Insufficient documentation

## 2010-10-20 NOTE — Assessment & Plan Note (Signed)
Was doing better on imipramin with mood and sleep per wife, but no constipation off this med. Restart but at lower dose.

## 2010-10-20 NOTE — Assessment & Plan Note (Signed)
Stable followed by Cardiology.

## 2010-10-20 NOTE — Assessment & Plan Note (Signed)
LDL at goal on current meds, but triglycerides up.  Encouraged exercise, weight loss, healthy eating habits. Recheck in 3 months.

## 2010-10-20 NOTE — Assessment & Plan Note (Signed)
Likely eczema vs contact derm along with dry skin.Marland Kitchen treat with moisturizing cream, apply topical steroid follow up if not improving in 2 weeks.

## 2010-10-20 NOTE — Assessment & Plan Note (Signed)
Moderate control on current meds. Get back on track with lifestyle changes, recheck in 3 months.

## 2010-11-30 ENCOUNTER — Telehealth: Payer: Self-pay | Admitting: *Deleted

## 2010-11-30 MED ORDER — IMIPRAMINE HCL 25 MG PO TABS: 25.0000 mg | ORAL_TABLET | Freq: Every day | ORAL | Status: DC

## 2010-11-30 NOTE — Telephone Encounter (Signed)
Agreed, sent in prescription for 25 mg dose which he was doing well on prior.

## 2010-11-30 NOTE — Telephone Encounter (Signed)
Patients wife says that she thinks that patient should go back up on his imipramine. She said it was decreased at last office visit. Please advise

## 2010-12-04 ENCOUNTER — Encounter: Payer: Self-pay | Admitting: Family Medicine

## 2010-12-04 ENCOUNTER — Ambulatory Visit (INDEPENDENT_AMBULATORY_CARE_PROVIDER_SITE_OTHER): Payer: Medicare Other | Admitting: Family Medicine

## 2010-12-04 DIAGNOSIS — M546 Pain in thoracic spine: Secondary | ICD-10-CM

## 2010-12-04 NOTE — Progress Notes (Signed)
  Subjective:    Patient ID: Shawn Zamora, male    DOB: 09/12/1923, 77 y.o.   MRN: [id number]  HPI  Shawn Zamora, a 77 y.o. male presents today in the office for the following:    Doing exercise and having some PT. F/u generalized back pain and deconditioning and weakness. Not compliant with HEP, but wanting to join "silver sneakers"  No pain right now - feels well.  The PMH, PSH, Social History, Family History, Medications, and allergies have been reviewed in [facility], and have been updated if relevant.   Review of Systems REVIEW OF SYSTEMS  GEN: No fevers, chills. Nontoxic. Primarily MSK c/o today. MSK: Detailed in the HPI GI: tolerating PO intake without difficulty Otherwise the pertinent positives of the ROS are noted above.      Objective:   Physical Exam   Physical Exam  Blood pressure 118/80, pulse 84, temperature 98.2 F (36.8 C), temperature source Tympanic, height 6\' 2"  (1.88 m), weight 211 lb (95.709 kg), SpO2 95.00%.  GEN: WDWN, NAD, Non-toxic, A & O x 3 HEENT: Atraumatic, Normocephalic. Neck supple. No masses, No LAD. Ears and Nose: No external deformity. EXTR: No c/c/e NEURO Normal gait.  PSYCH: Normally interactive. Conversant. Not depressed or anxious appearing.  Calm demeanor.   NT throughout t and L back Modest restriction inlumbar flexion Str 5/5 LE -- much improved      Assessment & Plan:   1. Back pain, thoracic      Much improved, agree with long term str programming of great benefit.

## 2010-12-15 ENCOUNTER — Encounter: Payer: Self-pay | Admitting: Cardiovascular Disease

## 2010-12-28 ENCOUNTER — Encounter: Payer: Self-pay | Admitting: Cardiovascular Disease

## 2010-12-28 ENCOUNTER — Ambulatory Visit (INDEPENDENT_AMBULATORY_CARE_PROVIDER_SITE_OTHER): Payer: Medicare Other | Admitting: Cardiovascular Disease

## 2010-12-28 VITALS — BP 124/72 | HR 89 | Ht 74.0 in | Wt 209.0 lb

## 2010-12-28 DIAGNOSIS — E119 Type 2 diabetes mellitus without complications: Secondary | ICD-10-CM

## 2010-12-28 DIAGNOSIS — I25810 Atherosclerosis of coronary artery bypass graft(s) without angina pectoris: Secondary | ICD-10-CM

## 2010-12-28 DIAGNOSIS — I2510 Atherosclerotic heart disease of native coronary artery without angina pectoris: Secondary | ICD-10-CM

## 2010-12-28 DIAGNOSIS — E785 Hyperlipidemia, unspecified: Secondary | ICD-10-CM

## 2010-12-28 NOTE — Assessment & Plan Note (Signed)
Cholesterol is at goal on the current lipid regimen. No changes to the medications were made.  

## 2010-12-28 NOTE — Patient Instructions (Addendum)
You are doing well. No medication changes were made.  Start walking at the gym if possible  Please call us if you have new issues that need to be addressed before your next appt.  The office will contact you for a follow up Appt. In 6 months

## 2010-12-28 NOTE — Progress Notes (Signed)
Patient ID: Shawn Zamora, male    DOB: 02-Dec-1923, 77 y.o.   MRN: [id number]  HPI Comments: Mr. Zwahlen is a very pleasant 77 year old gentleman with a history of coronary artery disease, bypass surgery in 2001 with a vein graft to the LAD, vein graft to the ramus and vein graft to the PDA in October 2001, with catheterization for shortness of breath in January 2010 showing proximal LAD occlusion with left to left collaterals, 100% mid RCA disease, patent vein graft to the RCA, patent vein graft to the ramus with occluded vein graft to the LAD, ejection fraction 40%. He presents for routine followup.   He reports that his balance has been poor. His wife describes that he is weak, falls backwards frequently. He has not participated in physical therapy at [facility] anymore as this is too expensive. He denies any shortness of breath or chest discomfort. They recently signed up for a gym membership at [employer] though we'll cancel it as it is too expensive. They do not have a free gym membership through their insurance.  Money is a problem as he has severe macular degenerative disease and requires expensive shots in his eyes were formed at [facility]. He is going to stop these as they are too expensive.  He was told that he had no significant stenoses in his legs or carotids by a local vascular physician. He does have chronic fatigue.   EKG shows normal sinus rhythm with rate 89 beats per minute, old inferior infarct, no other significant ST or T wave changes, PVCs        Outpatient Encounter Prescriptions as of 12/28/2010  Medication Sig Dispense Refill  . atorvastatin (LIPITOR) 40 MG tablet Take 1 tablet (40 mg total) by mouth daily.  90 tablet  3  . clopidogrel (PLAVIX) 75 MG tablet Take 1 tablet (75 mg total) by mouth daily.  90 tablet  3  . donepezil (ARICEPT) 5 MG tablet Take 1 tablet (5 mg total) by mouth at bedtime.  90 tablet  3  . glimepiride (AMARYL) 4 MG tablet Take 1 tablet (4 mg  total) by mouth 2 (two) times daily.  180 tablet  3  . Glucosamine-Chondroitin 1500-1200 MG/30ML LIQD Take by mouth daily.        Marland Kitchen imipramine (TOFRANIL) 25 MG tablet Take 1 tablet (25 mg total) by mouth at bedtime.  90 tablet  3  . LORazepam (ATIVAN) 0.5 MG tablet Take 0.5 mg by mouth daily as needed.        . metoprolol tartrate (LOPRESSOR) 25 MG tablet Take 1 tablet (25 mg total) by mouth 2 (two) times daily.  180 tablet  3  . Multiple Vitamin (MULTIVITAMIN) tablet Take 1 tablet by mouth daily.        . multivitamin-lutein (OCUVITE-LUTEIN) CAPS Take 1 capsule by mouth 2 (two) times daily.        . pregabalin (LYRICA) 75 MG capsule Take 1 capsule (75 mg total) by mouth 3 (three) times daily.  270 capsule  2  . sertraline (ZOLOFT) 100 MG tablet Take 1 tablet (100 mg total) by mouth at bedtime.  90 tablet  3  . sitaGLIPtan-metformin (JANUMET) 50-500 MG per tablet Take one by mouth daily.  90 tablet  3  . triamcinolone (KENALOG) 0.5 % cream Apply topically 3 (three) times daily.  30 g  0  . valACYclovir (VALTREX) 500 MG tablet Take 1 tablet (500 mg total) by mouth daily.  90 tablet  3     Review of Systems  Constitutional: Positive for fatigue.  HENT: Negative.   Eyes: Negative.   Cardiovascular: Negative.   Gastrointestinal: Negative.   Musculoskeletal: Positive for gait problem.  Skin: Negative.   Neurological: Positive for weakness.  Hematological: Negative.   Psychiatric/Behavioral: Negative.   All other systems reviewed and are negative.    BP 124/72  Pulse 89  Ht 6\' 2"  (1.88 m)  Wt 209 lb (94.802 kg)  BMI 26.83 kg/m2   Physical Exam  Nursing note and vitals reviewed. Constitutional: He is oriented to person, place, and time. He appears well-developed and well-nourished.  HENT:  Head: Normocephalic.  Nose: Nose normal.  Mouth/Throat: Oropharynx is clear and moist.  Eyes: Conjunctivae are normal. Pupils are equal, round, and reactive to light.  Neck: Normal range of  motion. Neck supple. No JVD present.  Cardiovascular: Normal rate, regular rhythm, S1 normal, S2 normal and intact distal pulses.  Exam reveals no gallop and no friction rub.   Murmur heard.  Crescendo systolic murmur is present with a grade of 2/6  Pulmonary/Chest: Effort normal and breath sounds normal. No respiratory distress. He has no wheezes. He has no rales. He exhibits no tenderness.  Abdominal: Soft. Bowel sounds are normal. He exhibits no distension. There is no tenderness.  Musculoskeletal: Normal range of motion. He exhibits no edema and no tenderness.  Lymphadenopathy:    He has no cervical adenopathy.  Neurological: He is alert and oriented to person, place, and time. Coordination normal.  Skin: Skin is warm and dry. No rash noted. No erythema.  Psychiatric: He has a normal mood and affect. His behavior is normal. Judgment and thought content normal.           Assessment and Plan

## 2010-12-28 NOTE — Assessment & Plan Note (Signed)
Currently with no symptoms of angina. No further workup at this time. Continue current medication regimen. 

## 2010-12-28 NOTE — Assessment & Plan Note (Signed)
We have encouraged continued exercise, careful diet management   

## 2011-01-01 ENCOUNTER — Telehealth: Payer: Self-pay | Admitting: *Deleted

## 2011-01-01 NOTE — Telephone Encounter (Signed)
Received paperwork from [facility] in [place] to complete for diabetic shoes and inserts.Shawn Zamora is in your in box for completion.

## 2011-01-02 NOTE — Telephone Encounter (Signed)
Will complete on my return.

## 2011-01-11 ENCOUNTER — Telehealth: Payer: Self-pay

## 2011-01-11 NOTE — Telephone Encounter (Signed)
Shawn Zamora called and wants form for diabetic shoes returned to man that requested info. I spoke with [name] and she will contact the man to get fax # to send form in. Patient's wife notified as instructed by telephone.

## 2011-01-29 ENCOUNTER — Ambulatory Visit (INDEPENDENT_AMBULATORY_CARE_PROVIDER_SITE_OTHER): Payer: Medicare Other | Admitting: Family Medicine

## 2011-01-29 ENCOUNTER — Encounter: Payer: Self-pay | Admitting: Family Medicine

## 2011-01-29 DIAGNOSIS — L84 Corns and callosities: Secondary | ICD-10-CM

## 2011-01-29 DIAGNOSIS — E119 Type 2 diabetes mellitus without complications: Secondary | ICD-10-CM

## 2011-01-29 NOTE — Assessment & Plan Note (Signed)
Definite need for DM therapeutic shoes. Paperwork completed.

## 2011-01-29 NOTE — Assessment & Plan Note (Signed)
Due for re-eval. Likely stable.

## 2011-01-29 NOTE — Progress Notes (Signed)
  Subjective:    Patient ID: Shawn Zamora, male    DOB: [birth year], 77 y.o.   MRN: [id number]  HPI  Diabetes:  Well controlled last check on amaryl, janumet Lab Results  Component Value Date   HGBA1C 7.1* 09/29/2010  Using medications without difficulties:None Hypoglycemic episodes:None Hyperglycemic episodes:None Feet problems:Yes Blood Sugars averaging: FBS 119, checking 2 times a day eye exam within last year:Yes.  He is interested in DM theraputic shoes. His feet do not hurt is wears old DM shoes.  Bottoms of feet with calluses, and hard as  leather.  Decreased circulation left foot, not bad enough to be treated with stent.     Review of Systems  Constitutional: Positive for fatigue. Negative for fever.  HENT: Negative for ear pain.   Eyes: Negative for pain.  Respiratory: Negative for shortness of breath.   Cardiovascular: Negative for chest pain.       Objective:   Physical Exam  Constitutional: Vital signs are normal. He appears well-developed and well-nourished.  HENT:  Head: Normocephalic.  Right Ear: Hearing normal.  Left Ear: Hearing normal.  Nose: Nose normal.  Mouth/Throat: Oropharynx is clear and moist and mucous membranes are normal.  Neck: Trachea normal. Carotid bruit is not present. No mass and no thyromegaly present.  Cardiovascular: Normal rate, regular rhythm and normal pulses.  Exam reveals no gallop, no distant heart sounds and no friction rub.   No murmur heard.      No peripheral edema  Pulmonary/Chest: Effort normal and breath sounds normal. No respiratory distress.  Skin: Skin is warm, dry and intact. No rash noted.  Psychiatric: He has a normal mood and affect. His speech is normal and behavior is normal. Thought content normal.   Diabetic foot exam: Abnormal inspection No skin breakdown Several calluses  Lateral midfoot and at base of great toe medially Decreased DP pulses, more so in left than right. Decreased sensation to  light touch and monofilament Nails thickened        Assessment & Plan:

## 2011-01-29 NOTE — Patient Instructions (Signed)
Make sure fasting labs are done in next few weeks... Since we have seen you today we can call with results on the phone. Otherwise follow up in 6 months for DM follow up appt. with fasting labs prior.

## 2011-02-08 ENCOUNTER — Other Ambulatory Visit: Payer: Self-pay | Admitting: Internal Medicine

## 2011-02-08 MED ORDER — PREGABALIN 75 MG PO CAPS: 75.0000 mg | ORAL_CAPSULE | Freq: Three times a day (TID) | ORAL | Status: DC

## 2011-02-08 NOTE — Telephone Encounter (Signed)
[facility]ill.  [facility] [address]

## 2011-02-08 NOTE — Telephone Encounter (Signed)
rx called to pharmacy 

## 2011-03-23 ENCOUNTER — Other Ambulatory Visit: Payer: Self-pay

## 2011-03-28 ENCOUNTER — Other Ambulatory Visit: Payer: Self-pay | Admitting: *Deleted

## 2011-03-28 MED ORDER — VALACYCLOVIR HCL 500 MG PO TABS: 500.0000 mg | ORAL_TABLET | Freq: Every day | ORAL | Status: DC

## 2011-03-28 MED ORDER — OMEPRAZOLE-SODIUM BICARBONATE 40-1100 MG PO CAPS: 1.0000 | ORAL_CAPSULE | Freq: Every day | ORAL | Status: DC

## 2011-03-28 MED ORDER — GLIMEPIRIDE 4 MG PO TABS: 4.0000 mg | ORAL_TABLET | Freq: Two times a day (BID) | ORAL | Status: AC

## 2011-03-29 MED ORDER — LORAZEPAM 0.5 MG PO TABS: 0.5000 mg | ORAL_TABLET | Freq: Every day | ORAL | Status: DC | PRN

## 2011-03-29 MED ORDER — PREGABALIN 75 MG PO CAPS: 75.0000 mg | ORAL_CAPSULE | Freq: Three times a day (TID) | ORAL | Status: DC

## 2011-03-29 MED ORDER — DONEPEZIL HCL 5 MG PO TABS: 5.0000 mg | ORAL_TABLET | Freq: Every day | ORAL | Status: DC

## 2011-04-03 ENCOUNTER — Other Ambulatory Visit: Payer: Self-pay | Admitting: *Deleted

## 2011-04-03 MED ORDER — LORAZEPAM 0.5 MG PO TABS: 0.5000 mg | ORAL_TABLET | Freq: Every day | ORAL | Status: DC | PRN

## 2011-04-03 MED ORDER — PREGABALIN 75 MG PO CAPS: 75.0000 mg | ORAL_CAPSULE | Freq: Three times a day (TID) | ORAL | Status: DC

## 2011-04-11 ENCOUNTER — Emergency Department (HOSPITAL_COMMUNITY)
Admission: EM | Admit: 2011-04-11 | Discharge: 2011-04-11 | Disposition: A | Discharge status: Home Health | Payer: Medicare Other | Attending: Emergency Medicine | Admitting: Emergency Medicine

## 2011-04-11 ENCOUNTER — Encounter (HOSPITAL_COMMUNITY): Payer: Self-pay | Admitting: Emergency Medicine

## 2011-04-11 ENCOUNTER — Telehealth: Payer: Self-pay | Admitting: Family Medicine

## 2011-04-11 DIAGNOSIS — E119 Type 2 diabetes mellitus without complications: Secondary | ICD-10-CM | POA: Insufficient documentation

## 2011-04-11 DIAGNOSIS — Z951 Presence of aortocoronary bypass graft: Secondary | ICD-10-CM | POA: Insufficient documentation

## 2011-04-11 DIAGNOSIS — I739 Peripheral vascular disease, unspecified: Secondary | ICD-10-CM | POA: Insufficient documentation

## 2011-04-11 DIAGNOSIS — S61209A Unspecified open wound of unspecified finger without damage to nail, initial encounter: Secondary | ICD-10-CM | POA: Insufficient documentation

## 2011-04-11 DIAGNOSIS — T148XXA Other injury of unspecified body region, initial encounter: Secondary | ICD-10-CM

## 2011-04-11 DIAGNOSIS — W540XXA Bitten by dog, initial encounter: Secondary | ICD-10-CM | POA: Insufficient documentation

## 2011-04-11 DIAGNOSIS — Z87891 Personal history of nicotine dependence: Secondary | ICD-10-CM | POA: Insufficient documentation

## 2011-04-11 DIAGNOSIS — I2510 Atherosclerotic heart disease of native coronary artery without angina pectoris: Secondary | ICD-10-CM | POA: Insufficient documentation

## 2011-04-11 MED ORDER — TETANUS-DIPHTH-ACELL PERTUSSIS 5-2.5-18.5 LF-MCG/0.5 IM SUSP
0.5000 mL | Freq: Once | INTRAMUSCULAR | Status: AC
  Administered 2011-04-11: 0.5 mL via INTRAMUSCULAR
  Filled 2011-04-11: qty 0.5

## 2011-04-11 MED ORDER — AMOXICILLIN-POT CLAVULANATE 875-125 MG PO TABS: 1.0000 | ORAL_TABLET | Freq: Two times a day (BID) | ORAL | Status: AC

## 2011-04-11 NOTE — Telephone Encounter (Signed)
To: [facility] (Daytime Triage) Fax: [phone] From: [facility] Date/ Time: 04/11/2011 12:47 PM Taken By: Sundra Aland, RN Caller: Willaim Sheng Facility: not collected Patient: Shawn Zamora, Shawn Zamora DOB: Feb 22, 1923 Phone: [phone] Reason for Call: Pt sent to ED for full thickness dog bite to rt ring finger. [place] Regarding Appointment: Appt Date: Appt Time: Unknown Provider: Reason: Details: Outcome:

## 2011-04-11 NOTE — ED Notes (Signed)
Patient states today dog bite from a stranger's dog.  Stated dog has all shots up to date.  Bite right 4th finger. Bleeding controlled with bandaged. Takes plavix.

## 2011-04-11 NOTE — Telephone Encounter (Signed)
Triage Record Num: [id number] Operator: Durward Mallard DiMatteis Patient Name: Shawn Zamora Call Date & Time: 04/11/2011 12:30:12PM Patient Phone: [phone] PCP: Kerby Nora Patient Gender: Male PCP Fax : [phone] Patient DOB: January 31, 1923 Practice Name: [facility] Reason for Call: Caller: Billie/Spouse; PCP: Excell Seltzer.; CB#: [phone]; Call regarding Animal Bite that took place today 04/11/11; dog bite occured to the right hand 4th finger; bleeding to the site; bleeding has slowed down now; states that the bite went down to the bone; Triaged per Bites-Animal or Human Guideline; See in ED Immed d/t any full thickness puncture wound; will go to [facility] ER now Protocol(s) Used: Bites - Animal or Human Recommended Outcome per Protocol: See ED Immediately Reason for Outcome: Any full thickness puncture wound (passes through the skin layers of epidermis and dermis and penetrates into the underlying subcutaneous tissue) Care Advice: ~ Another adult should drive. ~ Do not give the patient anything to eat or drink. While in transit, if bleeding cannot be controlled after 10 minutes of firm pressure, elevate the injured part, if possible, and apply firm pressure with flat fingers to the artery between the heart and the affected area. ~ ~ Apply cloth-covered ice pack to area to relieve pain and swelling. Write down provider's name. List or place the following in a bag for transport with the patient: current prescription and/or nonprescription medications; alternative treatments, therapies and medications; and street drugs. ~ Cleanse wound by flushing with water or saline, if available, and let it bleed to remove as much foreign material as possible. Do not use hydrogen peroxide when cleaning the wound as it may cause more tissue damage. ~ Control Bleeding: - Cover wound with a clean cloth and apply firm pressure to control bleeding. - If bleeding continues through dressing, add  more material. - DO NOT remove old dressing. - Continue to apply direct pressure. ~ 04/11/2011 12:48:40PM Page 1 of 1 [id number]

## 2011-04-11 NOTE — ED Notes (Signed)
Pt has small lac to 4th right finger. Pt states "It just feels as if I jammed it now." Pt is on Plavix. Site no longer bleeding. NAD noted. Pt states dog is current on shots

## 2011-04-11 NOTE — ED Notes (Signed)
Pt d/c home in NAD. Pt voiced understanding of d/c instructions.  

## 2011-04-11 NOTE — ED Provider Notes (Signed)
History     CSN: [id number]  Arrival date & time 04/11/11  1337   None     Chief Complaint  Patient presents with  . Animal Bite    (Consider location/radiation/quality/duration/timing/severity/associated sxs/prior treatment) HPI History provided by pt.   Pt was bitten by a neighbor's dog on right middle finger at noon today.  Wound is hemostatic and minimally painful.  No associated paresthesias.   The dog's rabies vaccinations are up to date.  Pt is unsure of when his last tetanus shot was.    Past Medical History  Diagnosis Date  . Dysthymic disorder   . Coronary atherosclerosis of artery bypass graft   . Corns and callosities   . 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 Type II or unspecified type diabetes mellitus without mention of complication, not stated as uncontrolled   . Other and unspecified hyperlipidemia   . Peripheral vascular disease, unspecified   . History of shingles     Past Surgical History  Procedure Date  . Coronary artery bypass graft     No family history on file.  History  Substance Use Topics  . Smoking status: Former Games developer  . Smokeless tobacco: Never Used  . Alcohol Use: No      Review of Systems  All other systems reviewed and are negative.    Allergies  Codeine  Home Medications   Current Outpatient Rx  Name Route Sig Dispense Refill  . ATORVASTATIN CALCIUM 40 MG PO TABS Oral Take 1 tablet (40 mg total) by mouth daily. 90 tablet 3  . CLOPIDOGREL BISULFATE 75 MG PO TABS Oral Take 1 tablet (75 mg total) by mouth daily. 90 tablet 3  . DONEPEZIL HCL 5 MG PO TABS Oral Take 1 tablet (5 mg total) by mouth at bedtime. 90 tablet 3  . GLIMEPIRIDE 4 MG PO TABS Oral Take 1 tablet (4 mg total) by mouth 2 (two) times daily. 180 tablet 3  . GLUCOSAMINE CHONDROITIN COMPLX PO Oral Take 1 tablet by mouth daily.    . IMIPRAMINE HCL 25 MG PO TABS Oral Take 1 tablet (25 mg total) by mouth at bedtime. 90  tablet 3  . LORAZEPAM 0.5 MG PO TABS Oral Take 1 tablet (0.5 mg total) by mouth daily as needed. 30 tablet 0  . METOPROLOL TARTRATE 25 MG PO TABS Oral Take 1 tablet (25 mg total) by mouth 2 (two) times daily. 180 tablet 3  . ONE-DAILY MULTI VITAMINS PO TABS Oral Take 1 tablet by mouth daily.      . OCUVITE-LUTEIN PO CAPS Oral Take 1 capsule by mouth 2 (two) times daily.      Marland Kitchen OMEPRAZOLE-SODIUM BICARBONATE 40-1100 MG PO CAPS Oral Take 1 capsule by mouth daily before breakfast. 90 capsule 3  . PREGABALIN 75 MG PO CAPS Oral Take 1 capsule (75 mg total) by mouth 3 (three) times daily. 270 capsule 3  . SERTRALINE HCL 100 MG PO TABS Oral Take 1 tablet (100 mg total) by mouth at bedtime. 90 tablet 3  . SITAGLIPTIN-METFORMIN HCL 50-500 MG PO TABS  Take one by mouth daily. 90 tablet 3  . TRIAMCINOLONE ACETONIDE 0.5 % EX CREA Topical Apply topically 3 (three) times daily. 30 g 0  . VALACYCLOVIR HCL 500 MG PO TABS Oral Take 1 tablet (500 mg total) by mouth daily. 90 tablet 3  . AMOXICILLIN-POT CLAVULANATE 875-125 MG PO TABS Oral Take 1 tablet by mouth every 12 (twelve)  hours. 14 tablet 0    BP 134/78  Pulse 76  Temp(Src) 97.8 F (36.6 C) (Oral)  Resp 16  SpO2 96%  Physical Exam  Nursing note and vitals reviewed. Constitutional: He is oriented to person, place, and time. He appears well-developed and well-nourished. No distress.  HENT:  Head: Normocephalic and atraumatic.  Eyes:       Normal appearance  Neck: Normal range of motion.  Musculoskeletal:       1x1cm skin avulsion just proximal to PIP joint.  Clean and hemostatic.  Mildly ttp.  No bony tenderness.  Full ROM of all joints w/out pain.  5/5 finger flexion/extension strength.  Distal sensation intact.    Neurological: He is alert and oriented to person, place, and time.  Psychiatric: He has a normal mood and affect. His behavior is normal.    ED Course  Procedures (including critical care time)  Labs Reviewed - No data to  display No results found.   1. Animal bite       MDM  Pt presents w/ dog bite to right middle finger.  Rabies vaccination up to date.  Doubt fx based on exam.  Nursing staff flushed w/ NS, applied bacitracin and bandaged.  Tetanus updated.  Pt d/c'd home w/ augmentin.  Strict return precautions discussed.         Otilio Miu, [place] 04/11/11 1723

## 2011-04-11 NOTE — ED Notes (Signed)
applied bacitracin on right hand third digit wound and placed a bandage; gave extra supplies to pt's wife for pt to use at home

## 2011-04-11 NOTE — Discharge Instructions (Signed)
Take antibiotic as prescribed.  Follow up with your family doctor or return to the ER right awaAnimal Bite An animal bite can result in a scratch on the skin, deep open cut, puncture of the skin, crush injury, or tearing away of the skin or a body part. Dogs are responsible for most animal bites. Children are bitten more often than adults. An animal bite can range from very mild to more serious. A small bite from your house pet is no cause for alarm. However, some animal bites can become infected or injure a bone or other tissue. You must seek medical care if:  The skin is broken and bleeding does not slow down or stop after 15 minutes.   The puncture is deep and difficult to clean (such as a cat bite).   Pain, warmth, redness, or pus develops around the wound.   The bite is from a stray animal or rodent. There may be a risk of rabies infection.   The bite is from a snake, raccoon, skunk, fox, coyote, or bat. There may be a risk of rabies infection.   The person bitten has a chronic illness such as diabetes, liver disease, or cancer, or the person takes medicine that lowers the immune system.   There is concern about the location and severity of the bite.  It is important to clean and protect an animal bite wound right away to prevent infection. Follow these steps:  Clean the wound with plenty of water and soap.   Apply an antibiotic cream.   Apply gentle pressure over the wound with a clean towel or gauze to slow or stop bleeding.   Elevate the affected area above the heart to help stop any bleeding.   Seek medical care. Getting medical care within 8 hours of the animal bite leads to the best possible outcome.  DIAGNOSIS  Your caregiver will most likely:  Take a detailed history of the animal and the bite injury.   Perform a wound exam.   Take your medical history.  Blood tests or X-rays may be performed. Sometimes, infected bite wounds are cultured and sent to a lab to identify  the infectious bacteria.  TREATMENT  Medical treatment will depend on the location and type of animal bite as well as the patient's medical history. Treatment may include:  Wound care, such as cleaning and flushing the wound with saline solution, bandaging, and elevating the affected area.   Antibiotics.   Tetanus immunization.   Rabies immunization.   Leaving the wound open to heal. This is often done with animal bites, due to the high risk of infection. However, in certain cases, wound closure with stitches, wound adhesive, skin adhesive strips, or staples may be used.  Infected bites that are left untreated may require intravenous (IV) antibiotics and surgical treatment in the hospital. HOME CARE INSTRUCTIONS  Follow your caregiver's instructions for wound care.   Take all medicines as directed.   If your caregiver prescribes antibiotics, take them as directed. Finish them even if you start to feel better.   Follow up with your caregiver for further exams or immunizations as directed.  You may need a tetanus shot if:  You cannot remember when you had your last tetanus shot.   You have never had a tetanus shot.   The injury broke your skin.  If you get a tetanus shot, your arm may swell, get red, and feel warm to the touch. This is common and not a  problem. If you need a tetanus shot and you choose not to have one, there is a rare chance of getting tetanus. Sickness from tetanus can be serious. SEEK MEDICAL CARE IF:  You notice warmth, redness, soreness, swelling, pus discharge, or a bad smell coming from the wound.   You have a red line on the skin coming from the wound.   You have a fever, chills, or a general ill feeling.   You have nausea or vomiting.   You have continued or worsening pain.   You have trouble moving the injured part.   You have other questions or concerns.  MAKE SURE YOU:  Understand these instructions.   Will watch your condition.   Will  get help right away if you are not doing well or get worse.  Document Released: 10/03/2010 Document Revised: 01/04/2011 Document Reviewed: 10/03/2010 [facility] Patient Information 2012 [place], [place].y if you develop fever, worsening pain, spreading redness or drainage of pus at site of wound.

## 2011-04-12 NOTE — Telephone Encounter (Signed)
Noted  

## 2011-04-13 NOTE — ED Provider Notes (Signed)
Medical screening examination/treatment/procedure(s) were performed by non-physician practitioner and as supervising physician I was immediately available for consultation/collaboration.  Shelda Jakes, MD 04/13/11 2258

## 2011-05-15 ENCOUNTER — Other Ambulatory Visit: Payer: Self-pay | Admitting: Cardiovascular Disease

## 2011-05-15 MED ORDER — CLOPIDOGREL BISULFATE 75 MG PO TABS: 75.0000 mg | ORAL_TABLET | Freq: Every day | ORAL | Status: DC

## 2011-05-15 NOTE — Telephone Encounter (Signed)
Pt called into pharmacy and was told not available.

## 2011-05-21 ENCOUNTER — Other Ambulatory Visit: Payer: Self-pay | Admitting: Cardiovascular Disease

## 2011-05-22 ENCOUNTER — Other Ambulatory Visit: Payer: Self-pay | Admitting: Cardiovascular Disease

## 2011-05-22 MED ORDER — CLOPIDOGREL BISULFATE 75 MG PO TABS: 75.0000 mg | ORAL_TABLET | Freq: Every day | ORAL | Status: DC

## 2011-05-22 MED ORDER — METOPROLOL TARTRATE 25 MG PO TABS: 25.0000 mg | ORAL_TABLET | Freq: Two times a day (BID) | ORAL | Status: DC

## 2011-05-22 MED ORDER — ATORVASTATIN CALCIUM 40 MG PO TABS: 40.0000 mg | ORAL_TABLET | Freq: Every day | ORAL | Status: DC

## 2011-05-22 NOTE — Telephone Encounter (Signed)
Refilled metoprolol, atorvastatin and clopidogral.

## 2011-05-29 ENCOUNTER — Other Ambulatory Visit: Payer: Self-pay

## 2011-05-29 NOTE — Telephone Encounter (Signed)
Pt wife request refills on plavix and lipitor. Pts wife will ck with [facility]; was filled 05/22/11 by Dr Mariah Milling.

## 2011-07-06 ENCOUNTER — Encounter: Payer: Self-pay | Admitting: Cardiovascular Disease

## 2011-07-06 ENCOUNTER — Emergency Department: Payer: Self-pay | Admitting: Emergency Medicine

## 2011-07-06 ENCOUNTER — Telehealth: Payer: Self-pay

## 2011-07-06 ENCOUNTER — Ambulatory Visit (INDEPENDENT_AMBULATORY_CARE_PROVIDER_SITE_OTHER): Payer: Medicare Other | Admitting: Cardiovascular Disease

## 2011-07-06 VITALS — BP 100/70 | HR 87 | Ht 75.0 in | Wt 206.0 lb

## 2011-07-06 DIAGNOSIS — E119 Type 2 diabetes mellitus without complications: Secondary | ICD-10-CM

## 2011-07-06 DIAGNOSIS — I25810 Atherosclerosis of coronary artery bypass graft(s) without angina pectoris: Secondary | ICD-10-CM

## 2011-07-06 DIAGNOSIS — E785 Hyperlipidemia, unspecified: Secondary | ICD-10-CM

## 2011-07-06 DIAGNOSIS — R61 Generalized hyperhidrosis: Secondary | ICD-10-CM

## 2011-07-06 LAB — URINALYSIS, COMPLETE
Bacteria: NONE SEEN
Bilirubin,UR: NEGATIVE
Blood: NEGATIVE
Glucose,UR: NEGATIVE mg/dL (ref 0–75)
Leukocyte Esterase: NEGATIVE
Protein: NEGATIVE
RBC,UR: <1 /HPF (ref 0–5)
Specific Gravity: 1.016 (ref 1.003–1.030)
Squamous Epithelial: NONE SEEN
WBC UR: <1 /HPF (ref 0–5)

## 2011-07-06 LAB — CBC
HCT: 47.8 % (ref 40.0–52.0)
HGB: 15.7 g/dL (ref 13.0–18.0)
MCH: 31.7 pg (ref 26.0–34.0)
MCHC: 32.9 g/dL (ref 32.0–36.0)
MCV: 96 fL (ref 80–100)
RBC: 4.96 10*6/uL (ref 4.40–5.90)

## 2011-07-06 LAB — BASIC METABOLIC PANEL
Calcium, Total: 9.7 mg/dL (ref 8.5–10.1)
Creatinine: 1.19 mg/dL (ref 0.60–1.30)
EGFR (African American): >60
Osmolality: 279 (ref 275–301)
Sodium: 138 mmol/L (ref 136–145)

## 2011-07-06 LAB — TROPONIN I: Troponin-I: <0.02 ng/mL

## 2011-07-06 LAB — PRO B NATRIURETIC PEPTIDE: B-Type Natriuretic Peptide: 390 pg/mL (ref 0–450)

## 2011-07-06 MED ORDER — LEVOFLOXACIN 750 MG PO TABS: 750.0000 mg | ORAL_TABLET | Freq: Every day | ORAL | Status: AC

## 2011-07-06 NOTE — Assessment & Plan Note (Signed)
Continue current cholesterol medication. 

## 2011-07-06 NOTE — Assessment & Plan Note (Signed)
Uncertain if his sweating, malaise, shortness of breath when supine his cardiac in etiology. We will suggest he go to the emergency room for further workup.

## 2011-07-06 NOTE — Assessment & Plan Note (Signed)
He reports sugar has been high yesterday, measured at 150. He does not feel his diaphoresis is from low blood sugar as it has been persistent for several days.

## 2011-07-06 NOTE — Assessment & Plan Note (Signed)
I am very concerned about his presentation today and global diaphoresis. He does not appear well. I'm concerned about underlying infection, possibly pneumonia. There is mild rales in the right base. He does have significant shortness of breath. Unable to exclude underlying MI though he does not have significant EKG changes. We will prescribe Levaquin to his pharmacy for possible pneumonia. We have asked him to go to the emergency room for blood work and chest x-ray. He will need cardiac enzymes to exclude MI. If he is released, we'll followup with him on Monday.

## 2011-07-06 NOTE — Telephone Encounter (Signed)
Pt.s wife called. She asks if pt can be seen today for c/o CP.  She says he has had it all week, intermittently, but has refused to come in all week until now.  She says he has had weakness, right arm pain, and periods of sweating all week.  He has not tried NTG but has been eating lots of TUMS.   I had her hold while I discussed with Dr. Mariah Milling.  He advised ok to bring pt in today at 3:30. If pt feels he cannot wait that long, he should go to ER.  I informed wife of this.  She verb. Understanding and will come in today at 3:30 pm unless he is feeling worse before then.  Then he will go to ER.

## 2011-07-06 NOTE — Patient Instructions (Addendum)
You are doing well. Take levoquin for possible pneumonia once a day Go to the ER Now for blood work   Please call us if you have new issues that need to be addressed before your next appt.  Your physician wants you to follow-up in: 1 months.

## 2011-07-06 NOTE — Progress Notes (Signed)
Patient ID: Shawn Zamora, male    DOB: 1923/08/18, 78 y.o.   MRN: [id number]  HPI Comments: Shawn Zamora is a very pleasant 78 year old gentleman with a history of coronary artery disease, bypass surgery in 2001 with a vein graft to the LAD, vein graft to the ramus and vein graft to the PDA in October 2001, with catheterization for shortness of breath in January 2010 showing proximal LAD occlusion with left to left collaterals, 100% mid RCA disease, patent vein graft to the RCA, patent vein graft to the ramus with occluded vein graft to the LAD, ejection fraction 40%. He presents for symptoms of malaise, SOB, sweating.  He reports that he has had a long history of sweats, sometimes at night but over the past 2 to 3 days, his sweats have been persistent. He reports having significant weakness, shortness of breath that is worse when lying flat in bed. He has nocturia. He had significant GI gas and bloating starting on Wednesday that persisted until this morning. He did have a bowel movement yesterday. No diarrhea. He does not feel at his baseline. Typically he is very active and his wife reports that he has not been doing anything the past few days secondary to shortness of breath, malaise/weakness.  In the office today, he is soaking wet. Arms, anterior chest, back or clammy. Perspiration has soaked through his shirt in the back.  His wife reports that previous heart attack presented in a similar manner.   EKG shows normal sinus rhythm with rate 87 beats per minute, old inferior MI, nonspecific ST and T wave abnormality. No significant change from prior EKGs.     Outpatient Encounter Prescriptions as of 07/06/2011  Medication Sig Dispense Refill  . Alum Hydroxide-Mag Carbonate (GAVISCON PO) Take by mouth daily as needed.      Marland Kitchen atorvastatin (LIPITOR) 40 MG tablet Take 1 tablet (40 mg total) by mouth daily.  90 tablet  3  . Calcium Carbonate Antacid (TUMS PO) Take by mouth daily.      .  clopidogrel (PLAVIX) 75 MG tablet Take 1 tablet (75 mg total) by mouth daily.  90 tablet  3  . donepezil (ARICEPT) 5 MG tablet Take 1 tablet (5 mg total) by mouth at bedtime.  90 tablet  3  . glimepiride (AMARYL) 4 MG tablet Take 1 tablet (4 mg total) by mouth 2 (two) times daily.  180 tablet  3  . GLUCOSAMINE CHONDROITIN COMPLX PO Take 1 tablet by mouth daily.      Marland Kitchen imipramine (TOFRANIL) 25 MG tablet Take 1 tablet (25 mg total) by mouth at bedtime.  90 tablet  3  . LORazepam (ATIVAN) 0.5 MG tablet Take 1 tablet (0.5 mg total) by mouth daily as needed.  30 tablet  0  . metoprolol tartrate (LOPRESSOR) 25 MG tablet Take 1 tablet (25 mg total) by mouth 2 (two) times daily.  180 tablet  3  . Multiple Vitamin (MULTIVITAMIN) tablet Take 1 tablet by mouth daily.        . multivitamin-lutein (OCUVITE-LUTEIN) CAPS Take 1 capsule by mouth 2 (two) times daily.        Marland Kitchen omeprazole-sodium bicarbonate (ZEGERID) 40-1100 MG per capsule Take 1 capsule by mouth daily before breakfast.  90 capsule  3  . pregabalin (LYRICA) 75 MG capsule Take 1 capsule (75 mg total) by mouth 3 (three) times daily.  270 capsule  3  . sertraline (ZOLOFT) 100 MG tablet Take 1 tablet (100 mg  total) by mouth at bedtime.  90 tablet  3  . sitaGLIPtan-metformin (JANUMET) 50-500 MG per tablet Take one by mouth daily.  90 tablet  3  . triamcinolone cream (KENALOG) 0.5 % Apply topically as needed.      . valACYclovir (VALTREX) 500 MG tablet Take 1 tablet (500 mg total) by mouth daily.  90 tablet  3   Review of Systems  Constitutional: Positive for fatigue.       Diaphoretic  HENT: Negative.   Eyes: Negative.   Respiratory: Positive for cough and shortness of breath.   Cardiovascular: Negative.   Gastrointestinal: Negative.   Musculoskeletal: Positive for gait problem.  Skin: Negative.   Neurological: Positive for weakness.  Hematological: Negative.   Psychiatric/Behavioral: Negative.   All other systems reviewed and are  negative.    BP 100/70  Pulse 87  Ht 6\' 3"  (1.905 m)  Wt 206 lb (93.441 kg)  BMI 25.75 kg/m2  Physical Exam  Nursing note and vitals reviewed. Constitutional: He is oriented to person, place, and time. He appears well-developed and well-nourished.       He is wet and clammy on his arms, chest, head.  HENT:  Head: Normocephalic.  Nose: Nose normal.  Mouth/Throat: Oropharynx is clear and moist.  Eyes: Conjunctivae are normal. Pupils are equal, round, and reactive to light.  Neck: Normal range of motion. Neck supple. No JVD present.  Cardiovascular: Normal rate, regular rhythm, S1 normal, S2 normal, normal heart sounds and intact distal pulses.  Exam reveals no gallop and no friction rub.   No murmur heard. Pulmonary/Chest: Effort normal and breath sounds normal. No respiratory distress. He has no wheezes. He has no rales. He exhibits no tenderness.  Abdominal: Soft. Bowel sounds are normal. He exhibits no distension. There is no tenderness.  Musculoskeletal: Normal range of motion. He exhibits no edema and no tenderness.  Lymphadenopathy:    He has no cervical adenopathy.  Neurological: He is alert and oriented to person, place, and time. Coordination normal.  Skin: Skin is warm and dry. No rash noted. No erythema.  Psychiatric: He has a normal mood and affect. His behavior is normal. Judgment and thought content normal.           Assessment and Plan

## 2011-07-07 ENCOUNTER — Telehealth: Payer: Self-pay | Admitting: Physician Assistant

## 2011-07-07 NOTE — Telephone Encounter (Signed)
The patient's daughter called. Patient was seen in the ER last night, they ruled out PE and ACS. They did a contrast CT of the chest. They were instructed to call his PCP to talk to them about his diabetes this morning but have not been able to get in touch with them (Dr. Daphine Deutscher office). She called our office to see if we were perhaps on call for them but we are not. His daughter is concerned because he is very weak and having bleeding from his penis. She does not think he is strong enough to sit in the ER again. She plans on trying Ruthe Mannan, her own PCP to see if she has any information on reaching her PCP's office. There is no active cardiac issue. I did advise her to keep trying the answering service as there must be someone on call from a primary care standpoint, and told her with the bleeding issue/weakness I would definitely recommend being seen in person - his daughter plans to try to call Dr. Elmer Sow office to see if she can reach the on-call person that way. She verbalized understanding and gratitude. Oneisha Ammons PA-C

## 2011-07-09 ENCOUNTER — Telehealth: Payer: Self-pay

## 2011-07-09 NOTE — Telephone Encounter (Signed)
I called pt to see how he was feeling.  Wife says overall, he is feeling better. He is still having periods of diaphoresis but denies CP/aches.  Denies fever. Is taking Levoquin as directed.  Will call us should symptoms change/worsen.

## 2011-07-10 ENCOUNTER — Encounter: Payer: Self-pay | Admitting: Cardiovascular Disease

## 2011-07-12 LAB — CULTURE, BLOOD (SINGLE)

## 2011-07-19 ENCOUNTER — Other Ambulatory Visit: Payer: Self-pay | Admitting: Family Medicine

## 2011-07-26 ENCOUNTER — Other Ambulatory Visit: Payer: Self-pay

## 2011-07-26 MED ORDER — SITAGLIPTIN PHOS-METFORMIN HCL 50-500 MG PO TABS: ORAL_TABLET | ORAL | Status: AC

## 2011-07-26 NOTE — Telephone Encounter (Signed)
Pt request Janumet refill to [facility] [address] # [phone] x [phone]. Pt to call for appt with labs prior. Pt notified by phone.

## 2011-07-28 ENCOUNTER — Other Ambulatory Visit: Payer: Self-pay | Admitting: Family Medicine

## 2011-08-03 ENCOUNTER — Other Ambulatory Visit: Payer: Self-pay | Admitting: *Deleted

## 2011-08-03 MED ORDER — CLOPIDOGREL BISULFATE 75 MG PO TABS: 75.0000 mg | ORAL_TABLET | Freq: Every day | ORAL | Status: DC

## 2011-08-03 NOTE — Telephone Encounter (Signed)
Refilled Clopidogrel. 

## 2011-08-09 ENCOUNTER — Other Ambulatory Visit: Payer: Self-pay | Admitting: *Deleted

## 2011-08-09 MED ORDER — SITAGLIPTIN PHOS-METFORMIN HCL 50-500 MG PO TABS: 1.0000 | ORAL_TABLET | Freq: Two times a day (BID) | ORAL | Status: DC

## 2011-08-13 ENCOUNTER — Other Ambulatory Visit: Payer: Self-pay

## 2011-08-13 NOTE — Telephone Encounter (Signed)
Shawn Zamora pts step daughter said [facility] needed override for age precaution; spoke with [facility] Child [phone] said pharmacist had already taken care of with override and needed call back from pt about shipping method. Ms Rennis Harding notified by phone.

## 2011-09-06 ENCOUNTER — Other Ambulatory Visit: Payer: Self-pay

## 2011-09-06 NOTE — Telephone Encounter (Signed)
Mrs. Shawn Zamora left v/m requesting refill sent to [facility] for Lyrica. 03/2011 # 270 with 3 refills done. Pt notified; pt will ck with [facility] if further questions pt will have [facility] contact our office.

## 2011-09-10 ENCOUNTER — Other Ambulatory Visit: Payer: Self-pay

## 2011-09-10 NOTE — Telephone Encounter (Signed)
[name],pts step daughter said Shawn Zamora does not have refills for Lyrica and request refills sent to [facility].Please advise.

## 2011-09-11 ENCOUNTER — Other Ambulatory Visit: Payer: Self-pay | Admitting: Family Medicine

## 2011-09-11 MED ORDER — PREGABALIN 75 MG PO CAPS: 75.0000 mg | ORAL_CAPSULE | Freq: Three times a day (TID) | ORAL | Status: AC

## 2011-09-11 NOTE — Telephone Encounter (Signed)
Shawn Zamora pts stepdaughter called back [facility] does not accept refills on Lyrica sice pt takes 3 times daily. Shawn Zamora requested refills on Donepezil; advised should have refills at [facility] until 03/2012. Shawn Zamora will ck with [facility].

## 2011-09-25 NOTE — Telephone Encounter (Signed)
Spoke with Shawn Zamora; she has already spoken with someone from our office (not sure of name) and pt has received Lyrica rx.

## 2011-10-04 ENCOUNTER — Other Ambulatory Visit: Payer: Self-pay

## 2011-10-04 MED ORDER — IMIPRAMINE HCL 25 MG PO TABS: 25.0000 mg | ORAL_TABLET | Freq: Every day | ORAL | Status: DC

## 2011-10-04 NOTE — Telephone Encounter (Signed)
Shawn Zamora requested refill Imipramine transferred from [facility] [address] to [facility]. Spoke with Shawn Zamora at [address] cancelled refill on imipramine and sent refill to [facility]; pt to call back for appt before med is gone.Shawn Zamora notified.

## 2011-12-21 ENCOUNTER — Telehealth: Payer: Self-pay | Admitting: *Deleted

## 2011-12-21 NOTE — Telephone Encounter (Signed)
error 

## 2013-03-03 ENCOUNTER — Encounter: Payer: Self-pay | Admitting: *Deleted

## 2013-08-04 ENCOUNTER — Encounter: Payer: Self-pay | Admitting: Family Medicine

## 2013-08-04 ENCOUNTER — Telehealth: Payer: Self-pay | Admitting: Family Medicine

## 2013-08-04 ENCOUNTER — Ambulatory Visit (INDEPENDENT_AMBULATORY_CARE_PROVIDER_SITE_OTHER)
Admission: RE | Admit: 2013-08-04 | Discharge: 2013-08-04 | Disposition: A | Discharge status: Home / Self Care | Payer: Medicare Other | Source: Ambulatory Visit | Attending: Family Medicine | Admitting: Family Medicine

## 2013-08-04 ENCOUNTER — Ambulatory Visit (INDEPENDENT_AMBULATORY_CARE_PROVIDER_SITE_OTHER): Payer: Medicare Other | Admitting: Family Medicine

## 2013-08-04 VITALS — BP 114/48 | HR 87 | Temp 98.9°F | Wt 216.0 lb

## 2013-08-04 DIAGNOSIS — R059 Cough, unspecified: Secondary | ICD-10-CM | POA: Insufficient documentation

## 2013-08-04 DIAGNOSIS — R05 Cough: Secondary | ICD-10-CM

## 2013-08-04 DIAGNOSIS — R9389 Abnormal findings on diagnostic imaging of other specified body structures: Secondary | ICD-10-CM

## 2013-08-04 DIAGNOSIS — J189 Pneumonia, unspecified organism: Secondary | ICD-10-CM | POA: Insufficient documentation

## 2013-08-04 MED ORDER — MOXIFLOXACIN HCL 400 MG PO TABS: 400.0000 mg | ORAL_TABLET | Freq: Every day | ORAL | Status: DC

## 2013-08-04 NOTE — Assessment & Plan Note (Signed)
Treat with avelox x 7 days, may need to extend course. Follow [facility] on Friday or [facility].  Go to ER if SOB.

## 2013-08-04 NOTE — Assessment & Plan Note (Signed)
Rounded opacity : PNA vs mass.  ONce antibotics completed will need repeat for resolution.

## 2013-08-04 NOTE — Telephone Encounter (Signed)
Patient Information:  Caller Name: Dreama SaaMarilyn Ellis  Phone: [phone]  Patient: Shawn Zamora, Shawn Zamora  Gender: Male  DOB: 03-Jun-1923  Age: 80 Years  PCP: Kerby NoraBedsole, [facility](Family Practice)  Office Follow Up:  Does the office need to follow up with this patient?: Yes  Instructions For The Office: Please contact Daughter .  No opening at [facility] or [place] office.  Appointment requested for evaluation.  RN Note:  Please contact Daughter .  No opening at [facility] [place] or [place] office.  Appointment requested for evaluation.  Symptoms  Reason For Call & Symptoms: Step-Daughter states he has  Chest congestion for x3 days . Temperature 100.1 (o). +chills. +coughing non productive.  No runny nose.  Slight wheezing.  Reviewed Health History In EMR: Yes  Reviewed Medications In EMR: Yes  Reviewed Allergies In EMR: Yes  Reviewed Surgeries / Procedures: Yes  Date of Onset of Symptoms: 08/01/2013  Treatments Tried: Coriceden OTC  Treatments Tried Worked: No  Any Fever: Yes  Fever Taken: Oral  Fever Time Of Reading: 12:00:00  Fever Last Reading: 99.7  Guideline(s) Used:  Cough  Disposition Per Guideline:   Go to Office Now  Reason For Disposition Reached:   Wheezing is present  Advice Given:  Coughing Spasms:  Drink warm fluids. Inhale warm mist (Reason: both relax the airway and loosen up the phlegm).  Suck on cough drops or hard candy to coat the irritated throat.  Prevent Dehydration:  Drink adequate liquids.  This will help soothe an irritated or dry throat and loosen up the phlegm.  Fever Medicines:  For fevers above 101 F (38.3 C) take either acetaminophen or ibuprofen.  They are over-the-counter (OTC) drugs that help treat both fever and pain. You can buy them at the drugstore.l  Acetaminophen (e.g., Tylenol):  Regular Strength Tylenol: Take 650 mg (two 325 mg pills) by mouth every 4-6 hours as needed. Each Regular Strength Tylenol pill has 325 mg of  acetaminophen.  Avoid Tobacco Smoke:  Smoking or being exposed to smoke makes coughs much worse.  Call Back If:  Difficulty breathing  You become worse.  RN Overrode Recommendation:  Make Appointment  Please contact Daughter .  No opening at [facility] or [place] office.  Appointment requested for evaluation.

## 2013-08-04 NOTE — Patient Instructions (Addendum)
   Start antibiotics.  I will call with radiologists impression of chest X-rat and possible need for further imaging. Go to ER if severe shortness of breath.  Follow up on Friday for recheck.

## 2013-08-04 NOTE — Telephone Encounter (Signed)
Lyla SonCarrie can you open up Dr. Daphine DeutscherBedsole's 3:30 slot on put Mr. Raczynski in.  Ms. Rennis Hardingllis notfied appointment scheduled with Dr. Ermalene SearingBedsole today at 3:30pm. Lupita Leashonna

## 2013-08-04 NOTE — Telephone Encounter (Signed)
Please call pt to set him up with appt with me today.Open a slot at  3:30 for him. Tell him there may be a wait.

## 2013-08-04 NOTE — Progress Notes (Signed)
   Subjective:    Patient ID: Shawn Zamora, male    DOB: Apr 09, 1923, 80 y.o.   MRN: [id number]  HPI Comments: No sick contacts  Cough This is a new problem. The current episode started in the past 7 days (2 days). The problem has been gradually worsening. The problem occurs constantly. The cough is non-productive. Associated symptoms include chills, a fever, nasal congestion, a sore throat and shortness of breath. Pertinent negatives include no chest pain, ear congestion, ear pain, headaches, postnasal drip or rhinorrhea. Associated symptoms comments: Mild SOB no ear pain . He has tried OTC cough suppressant (coricidin with tylenol) for the symptoms. The treatment provided mild relief. There is no history of asthma, bronchiectasis, bronchitis, COPD, emphysema, environmental allergies or pneumonia. remote smoking hx 70 years ago only for 1 year, Has CAD  Fever  This is a new problem. The current episode started today. The maximum temperature noted was 100 to 100.9 F. Associated symptoms include coughing and a sore throat. Pertinent negatives include no chest pain, ear pain or headaches.    Chest CT 2012 ? pulm emphysema and right ML bronchiectasis.  He does continue to have pain in right chest wall.  We have not been seeing him here regularly. [facility].  Review of Systems  Constitutional: Positive for fever and chills.  HENT: Positive for sore throat. Negative for ear pain, postnasal drip and rhinorrhea.   Respiratory: Positive for cough and shortness of breath.   Cardiovascular: Negative for chest pain.  Allergic/Immunologic: Negative for environmental allergies.  Neurological: Negative for headaches.       Objective:   Physical Exam  Constitutional: Vital signs are normal. He appears well-developed and well-nourished.  Fatigued appearing  HENT:  Head: Normocephalic.  Right Ear: Hearing normal.  Left Ear: Hearing normal.  Nose: Nose normal.  Mouth/Throat:  Oropharynx is clear and moist and mucous membranes are normal.  Neck: Trachea normal. Carotid bruit is not present. No mass and no thyromegaly present.  Cardiovascular: Normal rate, regular rhythm and normal pulses.  Exam reveals no gallop, no distant heart sounds and no friction rub.   No murmur heard. No peripheral edema  Pulmonary/Chest: Effort normal and breath sounds normal. No respiratory distress.  Skin: Skin is warm, dry and intact. No rash noted.  Psychiatric: He has a normal mood and affect. His speech is normal and behavior is normal. Thought content normal.          Assessment & Plan:

## 2013-08-10 ENCOUNTER — Telehealth: Payer: Self-pay

## 2013-08-10 NOTE — Telephone Encounter (Signed)
Pt was seen on 08/04/13 and [facility] has app[facility] on 08/11/13; Jola BabinskiMarilyn request copy of 08/04/13 office note, radiology report and CD of chest; Jola BabinskiMarilyn request to pick up today or 08/11/13 in early AM. Jola BabinskiMarilyn request cb [phone].

## 2013-08-10 NOTE — Telephone Encounter (Signed)
Medical records request was completed and left up front 08/10/13 at 4:00pm.

## 2013-09-19 IMAGING — CR DG CHEST 2V
1 series · 2 of 2 positions shown · non-contrast
Comparison: none

REASON FOR EXAM: chest pain  sob
COMMENTS:   LMP: (Male)

PROCEDURE:     DXR - DXR CHEST PA (OR AP) AND LATERAL  - July 06, 2011  [DATE]
RESULT:     Comparison: None

[Series 1: w chest pa · 0.14mm/px · 2 of 2 slices shown]
[im 1/2]
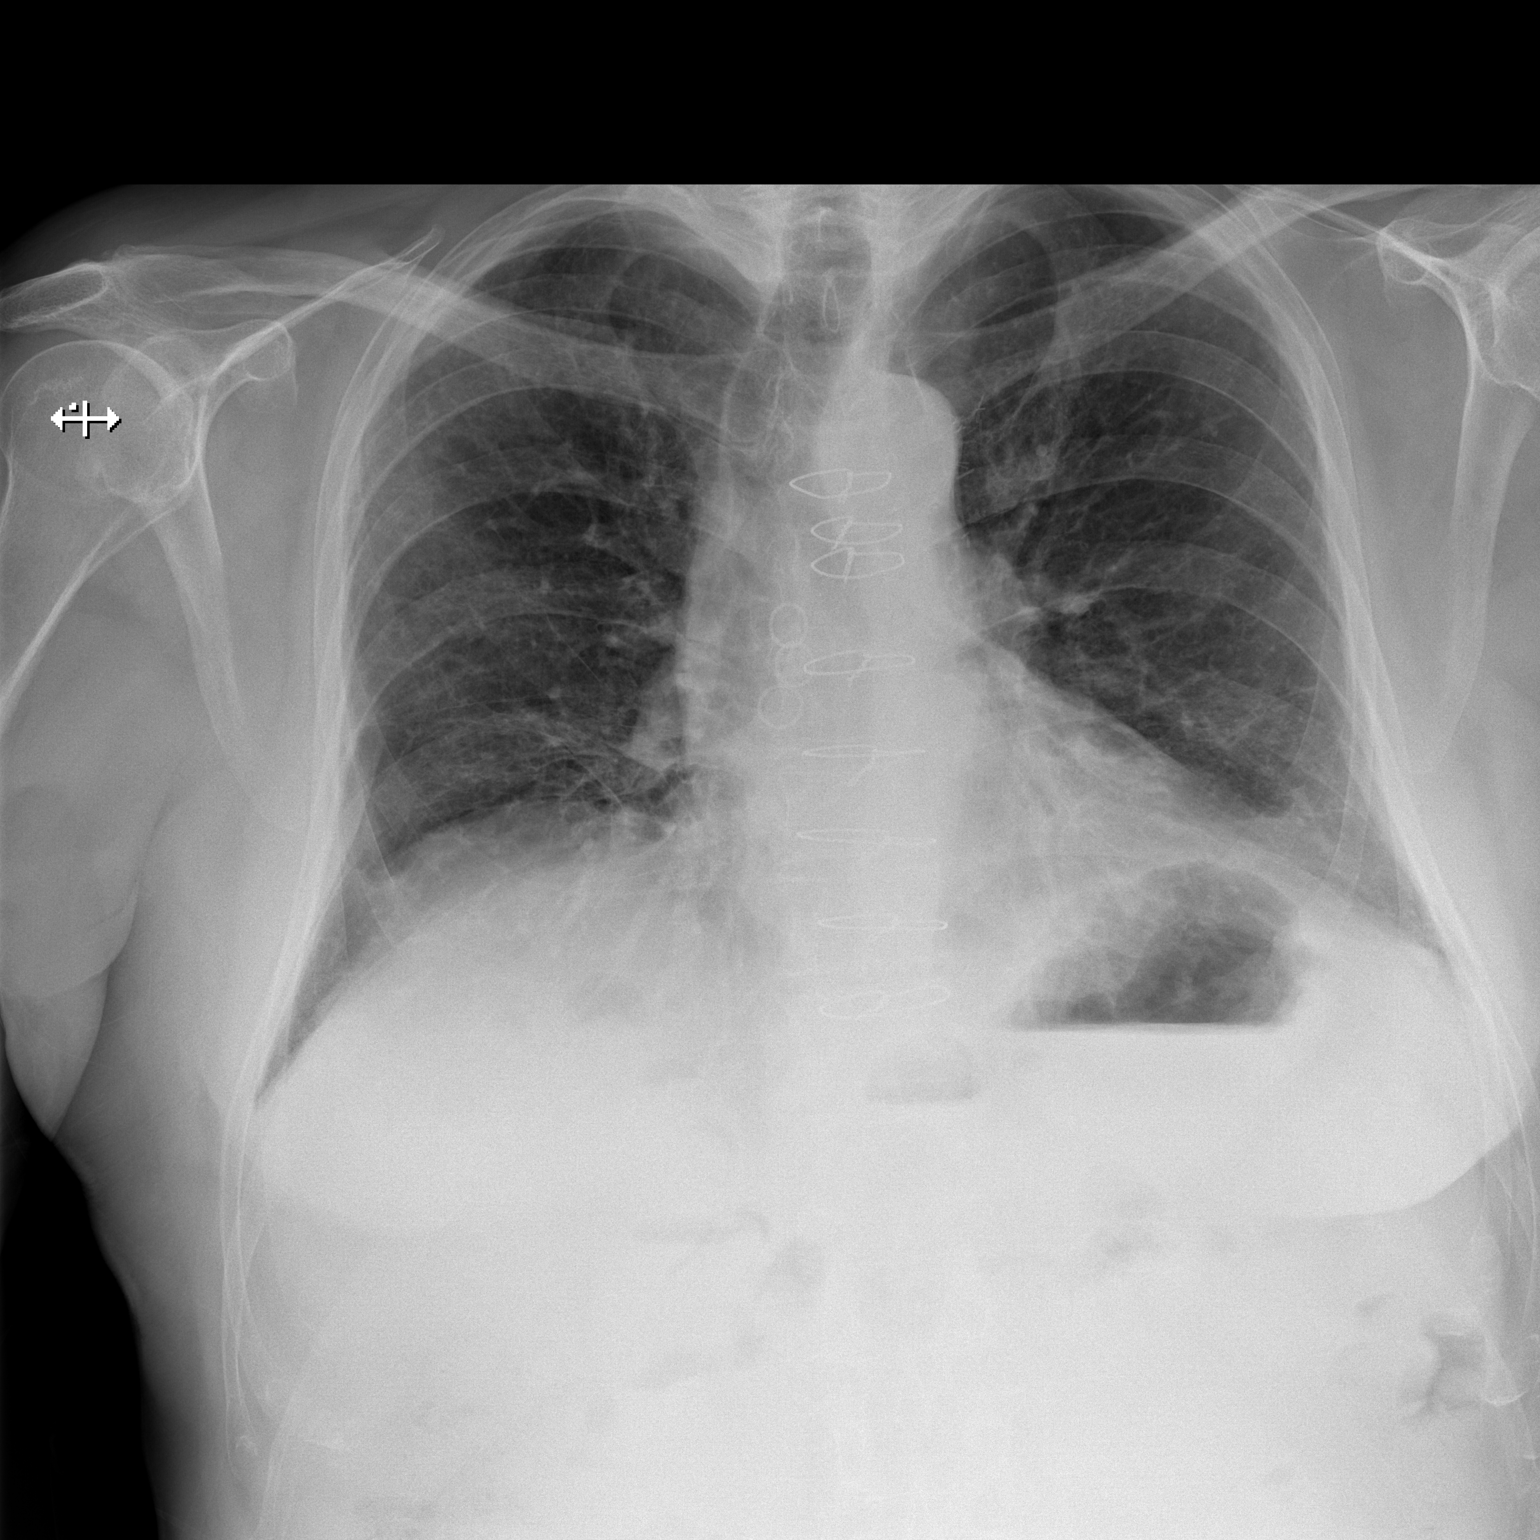
[im 2/2]
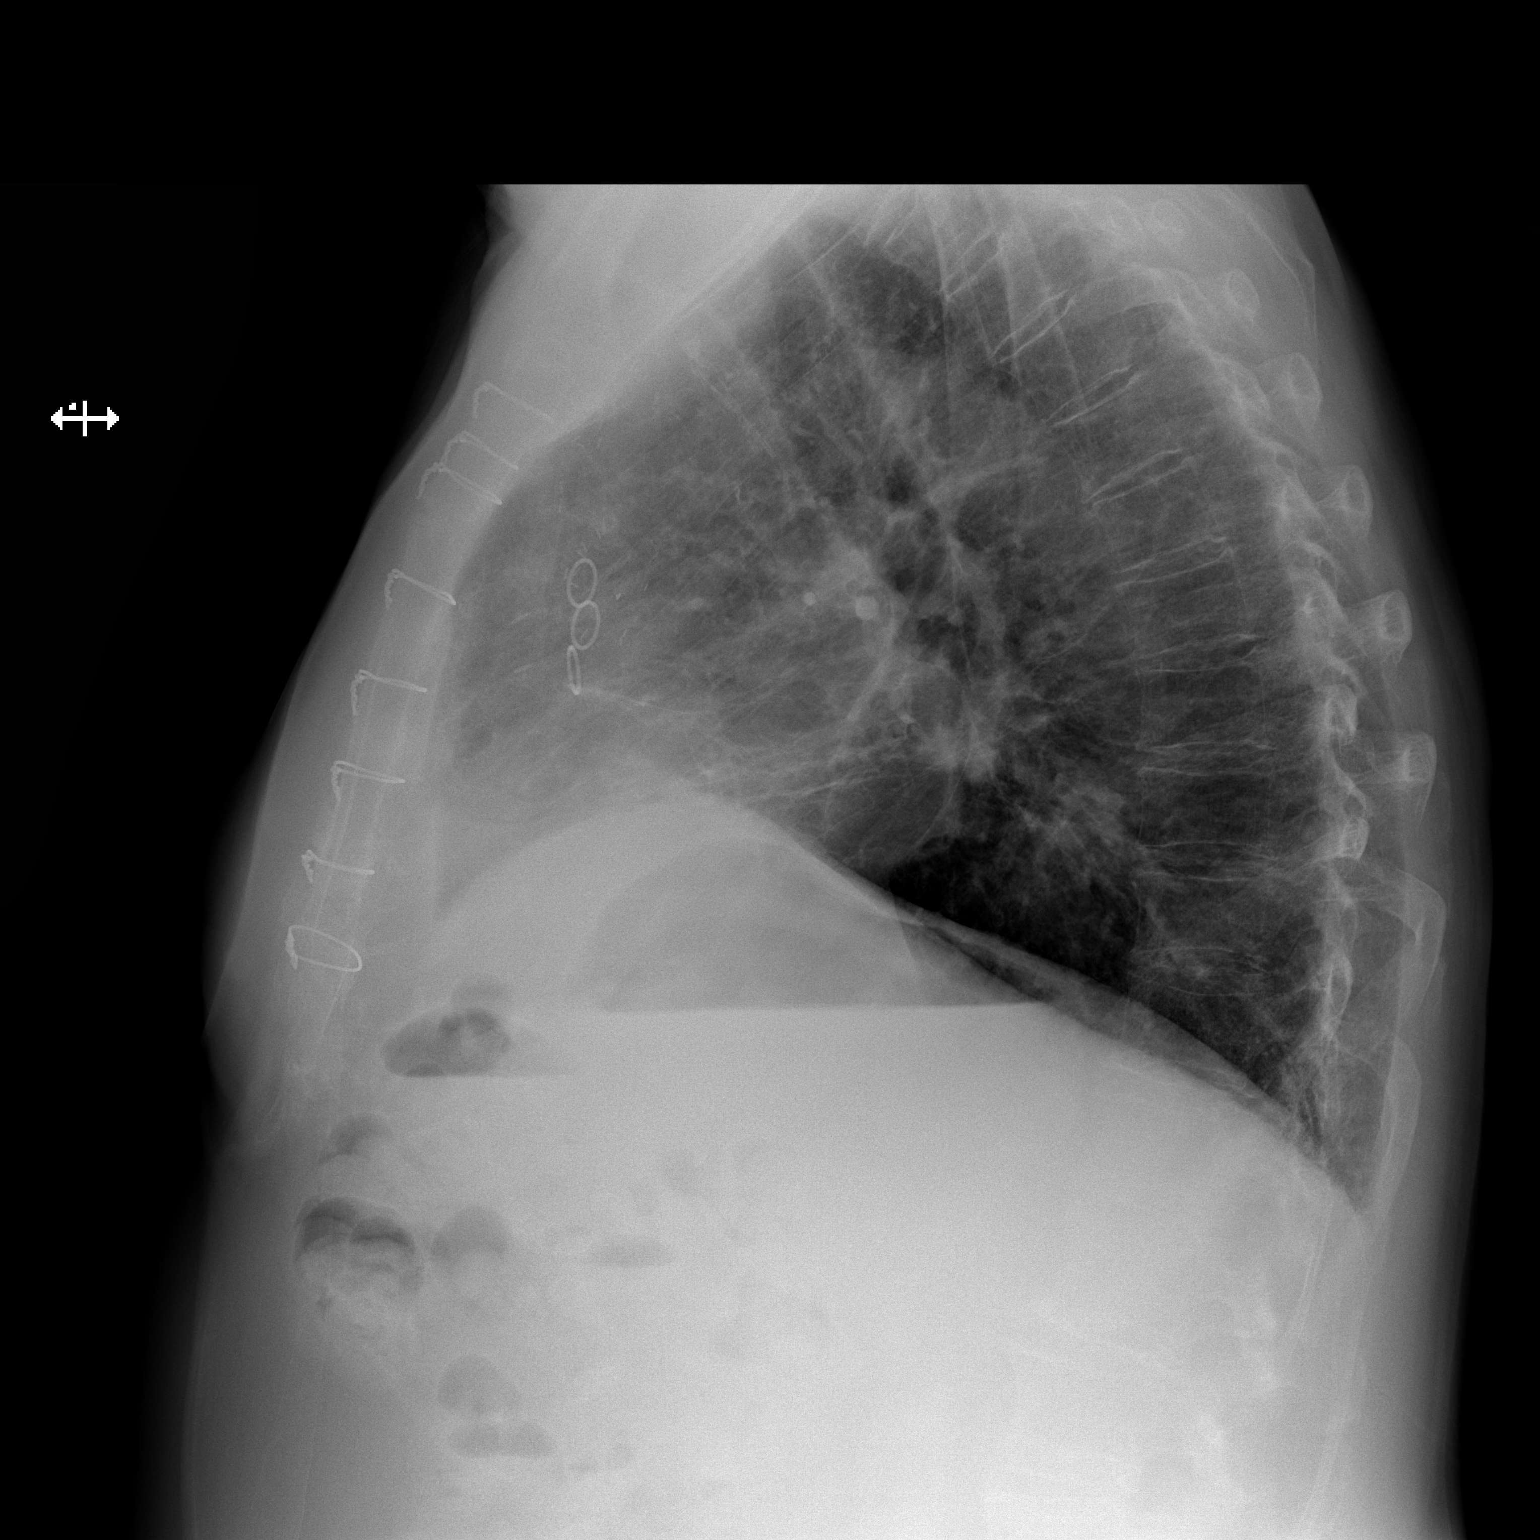

[2 of 2 positions shown; findings below may reference images not displayed]

FINDINGS: PA and lateral chest radiographs are provided. There is bilateral diffuse
interstitial thickening likely representing interstitial edema versus
interstitial pneumonitis secondary to an infectious or inflammatory
etiology. There is no focal parenchymal opacity, pleural effusion, or
pneumothorax. The heart and mediastinum are unremarkable. There is evidence
of prior CABG. The osseous structures are unremarkable.
IMPRESSION: There is bilateral diffuse interstitial thickening likely representing
interstitial edema versus interstitial pneumonitis secondary to an
infectious or inflammatory etiology.

[REDACTED]

## 2014-09-28 ENCOUNTER — Other Ambulatory Visit: Payer: Self-pay | Admitting: Internal Medicine

## 2014-09-28 DIAGNOSIS — R609 Edema, unspecified: Secondary | ICD-10-CM

## 2014-10-06 ENCOUNTER — Ambulatory Visit
Admission: RE | Admit: 2014-10-06 | Discharge: 2014-10-06 | Disposition: A | Discharge status: Home / Self Care | Payer: PRIVATE HEALTH INSURANCE | Source: Ambulatory Visit | Attending: Internal Medicine | Admitting: Internal Medicine

## 2014-10-06 DIAGNOSIS — R609 Edema, unspecified: Secondary | ICD-10-CM

## 2015-05-17 ENCOUNTER — Ambulatory Visit (INDEPENDENT_AMBULATORY_CARE_PROVIDER_SITE_OTHER): Payer: Medicare Other

## 2015-05-17 VITALS — BP 92/54 | HR 89 | Temp 97.5°F | Ht 75.0 in | Wt 219.5 lb

## 2015-05-17 DIAGNOSIS — Z0000 Encounter for general adult medical examination without abnormal findings: Secondary | ICD-10-CM | POA: Diagnosis not present

## 2015-05-17 DIAGNOSIS — Z23 Encounter for immunization: Secondary | ICD-10-CM | POA: Diagnosis not present

## 2015-05-17 NOTE — Progress Notes (Signed)
Subjective:   Shawn Zamora is a 82 y.o. male who presents for Medicare Annual/Subsequent preventive examination.   Cardiac Risk Factors include: advanced age (>43men, >64 women);diabetes mellitus;dyslipidemia;male gender     Objective:    Vitals: BP 92/54 mmHg  Pulse 89  Temp(Src) 97.5 F (36.4 C) (Oral)  Ht  (1.905 m)  Wt 219 lb 8 oz (99.565 kg)  BMI 27.44 kg/m2  SpO2 92%  Body mass index is 27.44 kg/(m^2).  Tobacco History  Smoking status  . Former Smoker  Smokeless tobacco  . Never Used     Counseling given: No   Past Medical History  Diagnosis Date  . Dysthymic disorder   . Coronary atherosclerosis of artery bypass graft   . Corns and callosities   . 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 Type II or unspecified type diabetes mellitus without mention of complication, not stated as uncontrolled   . Other and unspecified hyperlipidemia   . Peripheral vascular disease, unspecified (HCC)   . History of shingles   . Macular degeneration    Past Surgical History  Procedure Laterality Date  . Coronary artery bypass graft    . Cardiac catheterization    . Cornea transplant     History reviewed. No pertinent family history. History  Sexual Activity  . Sexual Activity: No    Outpatient Encounter Prescriptions as of 05/17/2015  Medication Sig  . acetaminophen (TYLENOL) 500 MG tablet Take 1,000 mg by mouth every 8 (eight) hours as needed.   Marland Kitchen ALFUZOSIN HCL ER PO Take 10 mg by mouth. Take 1 tab (10 mg) daily at bedtime  . Alum Hydroxide-Mag Carbonate (GAVISCON PO) Take by mouth daily as needed.  . Calcium Carbonate Antacid (TUMS PO) Take by mouth daily as needed (pt is taking daily as needed).   . carvedilol (COREG) 3.125 MG tablet Take 3.125 mg by mouth 2 (two) times daily with a meal.  . clopidogrel (PLAVIX) 75 MG tablet Take 1 tablet (75 mg total) by mouth daily.  . diclofenac sodium (VOLTAREN) 1 % GEL Apply  topically as needed (as needed for shoulder pain).  . finasteride (PROSCAR) 5 MG tablet Take 5 mg by mouth daily.  Marland Kitchen glimepiride (AMARYL) 4 MG tablet Take 1 tablet (4 mg total) by mouth 2 (two) times daily.  Marland Kitchen GLUCOSAMINE CHONDROITIN COMPLX PO Take 1 tablet by mouth daily.  . hydroxypropyl methylcellulose / hypromellose (ISOPTO TEARS / GONIOVISC) 2.5 % ophthalmic solution Place 1 drop into both eyes 4 (four) times daily.  Marland Kitchen lisinopril (PRINIVIL,ZESTRIL) 2.5 MG tablet Take 2.5 mg by mouth daily.  . Melatonin 10 MG TABS Take 10 mg by mouth at bedtime.  . Multiple Vitamin (MULTIVITAMIN) tablet Take 1 tablet by mouth daily.    . multivitamin-lutein (OCUVITE-LUTEIN) CAPS Take 1 capsule by mouth 2 (two) times daily.    Marland Kitchen omeprazole (PRILOSEC) 20 MG capsule Take 20 mg by mouth daily.  . prednisoLONE acetate (PRED FORTE) 1 % ophthalmic suspension Place 1 drop into the left eye 2 (two) times daily.  . pregabalin (LYRICA) 75 MG capsule Take 1 capsule (75 mg total) by mouth 3 (three) times daily.  . sertraline (ZOLOFT) 100 MG tablet Take 12.5 mg by mouth 2 (two) times daily. Take1/2 tab every morning and 1/2 tab every evening  . simvastatin (ZOCOR) 20 MG tablet Take 20 mg by mouth daily.  . sitaGLIPtan-metformin (JANUMET) 50-500 MG per tablet Take one by mouth daily.  . [  DISCONTINUED] lisinopril (PRINIVIL,ZESTRIL) 10 MG tablet Take 10 mg by mouth daily.  . [DISCONTINUED] omeprazole-sodium bicarbonate (ZEGERID) 40-1100 MG per capsule Take 1 capsule by mouth daily before breakfast.  . [DISCONTINUED] metoprolol tartrate (LOPRESSOR) 25 MG tablet Take 1 tablet (25 mg total) by mouth 2 (two) times daily.  . [DISCONTINUED] moxifloxacin (AVELOX) 400 MG tablet Take 1 tablet (400 mg total) by mouth daily.   No facility-administered encounter medications on file as of 05/17/2015.    Activities of Daily Living In your present state of health, do you have any difficulty performing the following activities: 05/17/2015    Hearing? Y  Vision? Y  Difficulty concentrating or making decisions? Y  Walking or climbing stairs? Y  Dressing or bathing? N  Doing errands, shopping? Y  Preparing Food and eating ? Y  Using the Toilet? N  In the past six months, have you accidently leaked urine? N  Do you have problems with loss of bowel control? N  Managing your Medications? Y  Managing your Finances? Y  Housekeeping or managing your Housekeeping? Y    Patient Care Team: Excell SeltzerAmy E Bedsole, MD as PCP - General (Family Medicine)   Assessment:     Hearing Screening   125Hz  250Hz  500Hz  1000Hz  2000Hz  4000Hz  8000Hz   Right ear:   40 0 0 0   Left ear:   40 0 0 0   Comments: Patient wears bilateral hearing aids but did not wear them to this encounter  Vision Screening Comments: Last eye exam in April 2017   Exercise Activities and Dietary recommendations Current Exercise Habits: Home exercise routine, Type of exercise: Other - see comments (stationary bike), Time (Minutes): 10, Frequency (Times/Week): 3, Weekly Exercise (Minutes/Week): 30, Intensity: Mild, Exercise limited by: orthopedic condition(s);cardiac condition(s)  Goals    . Increase physical activity     Starting 05/17/2015, I will continue to ride stationary bike for 8 min 3 days per week.       Fall Risk Fall Risk  05/17/2015  Falls in the past year? Yes  Number falls in past yr: 2 or more  Injury with Fall? Yes  Risk Factor Category  High Fall Risk  Risk for fall due to : History of fall(s);Impaired balance/gait;Impaired mobility;Impaired vision  Follow up Falls evaluation completed;Education provided;Falls prevention discussed   Depression Screen PHQ 2/9 Scores 05/17/2015  PHQ - 2 Score 0    Cognitive Testing MMSE - Mini Mental State Exam 05/17/2015  Orientation to time 5  Orientation to Place 5  Registration 3  Attention/ Calculation 0  Recall 3  Language- name 2 objects 0  Language- repeat 1  Language- follow 3 step command 3  Language-  read & follow direction 0  Write a sentence 0  Copy design 0  Total score 20   PLEASE NOTE: A Mini-Cog screen was completed. Maximum score is 20. A value of 0 denotes this part of Folstein MMSE was not completed.  Orientation to Time - Max 5 Orientation to Place - Max 5 Registration - Max 3 Recall - Max 3 Language Repeat - Max 1 Language Follow 3 Step Command - Max 3   Immunization History  Administered Date(s) Administered  . Influenza Whole 01/30/2008, 10/05/2010  . Pneumococcal Conjugate-13 05/17/2015  . Pneumococcal Polysaccharide-23 01/30/2008  . Td 10/04/2004  . Tdap 04/11/2011  . Zoster 10/05/2010   Screening Tests Health Maintenance  Topic Date Due  . FOOT EXAM  01/31/2016 (Originally 01/29/2012)  . HEMOGLOBIN A1C  01/31/2016 (  Originally 03/29/2011)  . INFLUENZA VACCINE  08/30/2015  . OPHTHALMOLOGY EXAM  04/29/2016  . TETANUS/TDAP  04/10/2021  . ZOSTAVAX  Completed  . PNA vac Low Risk Adult  Completed      Plan:     I have personally reviewed and addressed the Medicare Annual Wellness questionnaire and have noted the following in the patient's chart:  A. Medical and social history B. Use of alcohol, tobacco or illicit drugs  C. Current medications and supplements D. Functional ability and status E.  Nutritional status F.  Physical activity G. Advance directives H. List of other physicians I.  Hospitalizations, surgeries, and ER visits in previous 12 months J.  Vitals K. Screenings to include hearing, vision, cognitive, depression L. Referrals and appointments - none  In addition, I have reviewed and discussed with patient certain preventive protocols, quality metrics, and best practice recommendations. A written personalized care plan for preventive services as well as general preventive health recommendations were provided to patient.  See attached scanned questionnaire for additional information.   Signed,   Randa Evens, MHA, BS, LPN Health  Advisor 05/17/2015  I reviewed health advisor's note, was available for consultation, and agree with documentation and plan. Maryellen Pile MD.

## 2015-05-17 NOTE — Patient Instructions (Addendum)
Mr. Shawn Zamora , Thank you for taking time to come for your Medicare Wellness Visit. I appreciate your ongoing commitment to your health goals. Please review the following plan we discussed and let me know if I can assist you in the future.   These are the goals we discussed: Goals    . Increase physical activity     Starting 05/17/2015, I will continue to ride stationary bike for 8 min 3 days per week.        This is a list of the screening recommended for you and due dates:  Health Maintenance  Topic Date Due  . Pneumonia vaccines (2 of 2 - PCV13) Completed 05/17/2015  . Hemoglobin A1C  Postpone - may have been done at [facility]  . Eye exam for diabetics  04/29/2016  . Complete foot exam   01/31/2016 - [facility]  . Flu Shot  08/30/2015  . Tetanus Vaccine  04/10/2021  . Shingles Vaccine  Completed   Preventive Care for Adults  A healthy lifestyle and preventive care can promote health and wellness. Preventive health guidelines for adults include the following key practices.  . A routine yearly physical is a good way to check with your health care provider about your health and preventive screening. It is a chance to share any concerns and updates on your health and to receive a thorough exam.  . Visit your dentist for a routine exam and preventive care every 6 months. Brush your teeth twice a day and floss once a day. Good oral hygiene prevents tooth decay and gum disease.  . The frequency of eye exams is based on your age, health, family medical history, use  of contact lenses, and other factors. Follow your health care provider's ecommendations for frequency of eye exams.  . Eat a healthy diet. Foods like vegetables, fruits, whole grains, low-fat dairy products, and lean protein foods contain the nutrients you need without too many calories. Decrease your intake of foods high in solid fats, added sugars, and salt. Eat the right amount of calories for you. Get information about a  proper diet from your health care provider, if necessary.  . Regular physical exercise is one of the most important things you can do for your health. Most adults should get at least 150 minutes of moderate-intensity exercise (any activity that increases your heart rate and causes you to sweat) each week. In addition, most adults need muscle-strengthening exercises on 2 or more days a week.  Silver Sneakers may be a benefit available to you. To determine eligibility, you may visit the website: www.silversneakers.com or contact program at [phone] Mon-Fri between 8AM-8PM.   . Maintain a healthy weight. The body mass index (BMI) is a screening tool to identify possible weight problems. It provides an estimate of body fat based on height and weight. Your health care provider can find your BMI and can help you achieve or maintain a healthy weight.   For adults 20 years and older: ? A BMI below 18.5 is considered underweight. ? A BMI of 18.5 to 24.9 is normal. ? A BMI of 25 to 29.9 is considered overweight. ? A BMI of 30 and above is considered obese.   . Maintain normal blood lipids and cholesterol levels by exercising and minimizing your intake of saturated fat. Eat a balanced diet with plenty of fruit and vegetables. Blood tests for lipids and cholesterol should begin at age 80 and be repeated every 5 years. If  your lipid or cholesterol levels are high, you are over 50, or you are at high risk for heart disease, you may need your cholesterol levels checked more frequently. Ongoing high lipid and cholesterol levels should be treated with medicines if diet and exercise are not working.  . If you smoke, find out from your health care provider how to quit. If you do not use tobacco, please do not start.  . If you choose to drink alcohol, please do not consume more than 2 drinks per day. One drink is considered to be 12 ounces (355 mL) of beer, 5 ounces (148 mL) of wine, or 1.5 ounces (44 mL) of  liquor.  . If you are 74-39 years old, ask your health care provider if you should take aspirin to prevent strokes.  . Use sunscreen. Apply sunscreen liberally and repeatedly throughout the day. You should seek shade when your shadow is shorter than you. Protect yourself by wearing long sleeves, pants, a wide-brimmed hat, and sunglasses year round, whenever you are outdoors.  . Once a month, do a whole body skin exam, using a mirror to look at the skin on your back. Tell your health care provider of new moles, moles that have irregular borders, moles that are larger than a pencil eraser, or moles that have changed in shape or color.     Fall Prevention in the Home  Falls can cause injuries. They can happen to people of all ages. There are many things you can do to make your home safe and to help prevent falls.  WHAT CAN I DO ON THE OUTSIDE OF MY HOME?  Regularly fix the edges of walkways and driveways and fix any cracks.  Remove anything that might make you trip as you walk through a door, such as a raised step or threshold.  Trim any bushes or trees on the path to your home.  Use bright outdoor lighting.  Clear any walking paths of anything that might make someone trip, such as rocks or tools.  Regularly check to see if handrails are loose or broken. Make sure that both sides of any steps have handrails.  Any raised decks and porches should have guardrails on the edges.  Have any leaves, snow, or ice cleared regularly.  Use sand or salt on walking paths during winter.  Clean up any spills in your garage right away. This includes oil or grease spills. WHAT CAN I DO IN THE BATHROOM?   Use night lights.  Install grab bars by the toilet and in the tub and shower. Do not use towel bars as grab bars.  Use non-skid mats or decals in the tub or shower.  If you need to sit down in the shower, use a plastic, non-slip stool.  Keep the floor dry. Clean up any water that spills on the  floor as soon as it happens.  Remove soap buildup in the tub or shower regularly.  Attach bath mats securely with double-sided non-slip rug tape.  Do not have throw rugs and other things on the floor that can make you trip. WHAT CAN I DO IN THE BEDROOM?  Use night lights.  Make sure that you have a light by your bed that is easy to reach.  Do not use any sheets or blankets that are too big for your bed. They should not hang down onto the floor.  Have a firm chair that has side arms. You can use this for support while you get  dressed.  Do not have throw rugs and other things on the floor that can make you trip. WHAT CAN I DO IN THE KITCHEN?  Clean up any spills right away.  Avoid walking on wet floors.  Keep items that you use a lot in easy-to-reach places.  If you need to reach something above you, use a strong step stool that has a grab bar.  Keep electrical cords out of the way.  Do not use floor polish or wax that makes floors slippery. If you must use wax, use non-skid floor wax.  Do not have throw rugs and other things on the floor that can make you trip. WHAT CAN I DO WITH MY STAIRS?  Do not leave any items on the stairs.  Make sure that there are handrails on both sides of the stairs and use them. Fix handrails that are broken or loose. Make sure that handrails are as long as the stairways.  Check any carpeting to make sure that it is firmly attached to the stairs. Fix any carpet that is loose or worn.  Avoid having throw rugs at the top or bottom of the stairs. If you do have throw rugs, attach them to the floor with carpet tape.  Make sure that you have a light switch at the top of the stairs and the bottom of the stairs. If you do not have them, ask someone to add them for you. WHAT ELSE CAN I DO TO HELP PREVENT FALLS?  Wear shoes that:  Do not have high heels.  Have rubber bottoms.  Are comfortable and fit you well.  Are closed at the toe. Do not wear  sandals.  If you use a stepladder:  Make sure that it is fully opened. Do not climb a closed stepladder.  Make sure that both sides of the stepladder are locked into place.  Ask someone to hold it for you, if possible.  Clearly mark and make sure that you can see:  Any grab bars or handrails.  First and last steps.  Where the edge of each step is.  Use tools that help you move around (mobility aids) if they are needed. These include:  Canes.  Walkers.  Scooters.  Crutches.  Turn on the lights when you go into a dark area. Replace any light bulbs as soon as they burn out.  Set up your furniture so you have a clear path. Avoid moving your furniture around.  If any of your floors are uneven, fix them.  If there are any pets around you, be aware of where they are.  Review your medicines with your doctor. Some medicines can make you feel dizzy. This can increase your chance of falling. Ask your doctor what other things that you can do to help prevent falls.   This information is not intended to replace advice given to you by your health care provider. Make sure you discuss any questions you have with your health care provider.   Document Released: 11/11/2008 Document Revised: 06/01/2014 Document Reviewed: 02/19/2014 Elsevier Interactive Patient Education [employer].

## 2015-05-17 NOTE — Progress Notes (Signed)
Pre visit review using our clinic review tool, if applicable. No additional management support is needed unless otherwise documented below in the visit note. 

## 2015-05-17 NOTE — Progress Notes (Signed)
Nurse concerns:  Patient's BP was 92/54. PCP and daughter were notified. Daughter did not want to make an appointment with PCP at this time due to patient seeing a cardiologist in a few weeks. Daughter stated she would continue to monitor BP and notify PCP in the event there is a change in the patient's health status.

## 2015-08-11 ENCOUNTER — Encounter: Payer: Self-pay | Admitting: Family Medicine

## 2015-08-11 ENCOUNTER — Ambulatory Visit (INDEPENDENT_AMBULATORY_CARE_PROVIDER_SITE_OTHER): Payer: Medicare Other | Admitting: Family Medicine

## 2015-08-11 VITALS — BP 110/60 | HR 91 | Ht 75.0 in | Wt 210.0 lb

## 2015-08-11 DIAGNOSIS — I10 Essential (primary) hypertension: Secondary | ICD-10-CM

## 2015-08-11 DIAGNOSIS — N50819 Testicular pain, unspecified: Secondary | ICD-10-CM

## 2015-08-11 DIAGNOSIS — F321 Major depressive disorder, single episode, moderate: Secondary | ICD-10-CM

## 2015-08-11 DIAGNOSIS — E119 Type 2 diabetes mellitus without complications: Secondary | ICD-10-CM

## 2015-08-11 DIAGNOSIS — G3184 Mild cognitive impairment, so stated: Secondary | ICD-10-CM

## 2015-08-11 DIAGNOSIS — N400 Enlarged prostate without lower urinary tract symptoms: Secondary | ICD-10-CM | POA: Diagnosis not present

## 2015-08-11 DIAGNOSIS — N509 Disorder of male genital organs, unspecified: Secondary | ICD-10-CM | POA: Insufficient documentation

## 2015-08-11 DIAGNOSIS — M542 Cervicalgia: Secondary | ICD-10-CM

## 2015-08-11 DIAGNOSIS — E785 Hyperlipidemia, unspecified: Secondary | ICD-10-CM

## 2015-08-11 NOTE — Assessment & Plan Note (Signed)
Moderate control. [facility]

## 2015-08-11 NOTE — Assessment & Plan Note (Signed)
[facility] 

## 2015-08-11 NOTE — Assessment & Plan Note (Addendum)
Likely related to past injury. No clear abnormality on exam. Recommend further eval at [facility] with US. Also recommend briefs as opposed to boxers given pain occurs with more mobility of testicle.  No surgical indications seen at this time.

## 2015-08-11 NOTE — Assessment & Plan Note (Signed)
Likely MSK due to arthritis. No sign of herniated disc.  no red flags.

## 2015-08-11 NOTE — Assessment & Plan Note (Signed)
Stable control on sertraline  

## 2015-08-11 NOTE — Assessment & Plan Note (Signed)
Stable control on proscar, tamsulosin.

## 2015-08-11 NOTE — Progress Notes (Signed)
Subjective:    Patient ID: Shawn Zamora, male    DOB: 01/17/1924, 82 y.o.   MRN: [id number]  HPI  82 year old male with dementia presents for follow up... He has not been seen in 2 years and for DM in 5 years. He is seen by geriatric every 3 months at [facility].   Had part 1 of AMW  With Shawn Zamora 05/17/2015  [name] is worried about continuity at [facility].   CAD,s/p triple bypass 12 years ago, followed by cards at [facility].  Had ECHO recently? Results   HTN stable on lisinopril, coreg BP Readings from Last 3 Encounters:  08/11/15 110/60  05/17/15 92/54  08/04/13 114/48   Now off plavix, now on ASA 81 mg daily for cardiac secondary prevention.  Diabetes:   A1C 7.7 at last [facility] 03/2015 on Janumet and glimeperide Lab Results  Component Value Date   HGBA1C 7.1* 09/29/2010  Using medications without difficulties: Hypoglycemic episodes: Hyperglycemic episodes: Feet problems: Blood Sugars averaging: eye exam within last year:  Anxiety/depression, moderate:  Stable on sertlaine. Still gets anxious at time.  Elevated Cholesterol:  [facility]. Per daughter in law.Peri Jefferson. Good control last check on atorvastatin? Dose. Using medications without problems: Muscle aches:Diet compliance: Moderate Exercise: occ walking but does feel weak overall. Other complaints:   BPH: nocturia moderate on proscar, tamulosin  He has several concerns today:   1.Has pain in right neck and into right shoulder x 3 months, worse in last few weeks.   1 month ago had steroid in shoulder.  Pain in posterior shoulder better. No numbness or weakness in right arm.  2. He had right testicle compressed with wheelchair over 1 year ago. Had swelling at that time. Since that time it is sore off and on if something pulls on it like his underware. Still slightly swollen. Had US at [facility], not sure results.  Saw [facility] about it.  No recommendations.       Review of Systems  Constitutional: Positive for  fatigue. Negative for fever.  HENT: Negative for ear pain.   Eyes: Negative for pain.  Respiratory: Negative for cough and shortness of breath.   Cardiovascular: Negative for chest pain and leg swelling.  Gastrointestinal: Negative for abdominal pain.  Musculoskeletal: Positive for back pain.       Objective:   Physical Exam  Constitutional: Vital signs are normal. He appears well-developed and well-nourished.  HENT:  Head: Normocephalic.  Right Ear: Hearing normal.  Left Ear: Hearing normal.  Nose: Nose normal.  Mouth/Throat: Oropharynx is clear and moist and mucous membranes are normal.  Neck: Trachea normal. Muscular tenderness present. No spinous process tenderness present. Carotid bruit is not present. Decreased range of motion present. No thyroid mass and no thyromegaly present.  Neg spurling's  Cardiovascular: Normal rate, regular rhythm and normal pulses.  Exam reveals no gallop, no distant heart sounds and no friction rub.   No murmur heard. No peripheral edema  Pulmonary/Chest: Effort normal and breath sounds normal. No respiratory distress.  Abdominal: Hernia confirmed negative in the right inguinal area and confirmed negative in the left inguinal area.  Genitourinary: Penis normal. Right testis shows tenderness. Right testis shows no mass and no swelling. Right testis is descended. Cremasteric reflex is not absent on the right side. Left testis shows no mass, no swelling and no tenderness. Left testis is descended. Cremasteric reflex is not absent on the left side. No phimosis, paraphimosis, hypospadias, penile erythema  or penile tenderness. No discharge found.  Right testes slightly larger than left and slight varicocele on right  Lymphadenopathy:       Right: No inguinal adenopathy present.       Left: No inguinal adenopathy present.  Skin: Skin is warm, dry and intact. No rash noted.  Psychiatric: He has a normal mood and affect. His speech is normal and behavior is  normal. Thought content normal.          Assessment & Plan:

## 2015-08-11 NOTE — Patient Instructions (Signed)
Likely arthritis in neck. Use topical treatment or tylenol for pain.  I recommend UA to evaluated testicular pain further. Use mole skin on elbows for rubbing.

## 2015-08-11 NOTE — Assessment & Plan Note (Signed)
Well controlled. Continue current medication.  

## 2015-08-11 NOTE — Assessment & Plan Note (Signed)
Stable control on no med. 

## 2015-10-12 ENCOUNTER — Ambulatory Visit (INDEPENDENT_AMBULATORY_CARE_PROVIDER_SITE_OTHER): Payer: Medicare Other

## 2015-10-12 DIAGNOSIS — Z23 Encounter for immunization: Secondary | ICD-10-CM

## 2015-10-19 IMAGING — CR DG CHEST 2V
2 series · 2 of 2 positions shown · non-contrast
Comparison: 05/16/2010

CLINICAL DATA: Cough, fever, shortness of Breath

EXAM:
CHEST  2 VIEW

[view not recorded (1 of 2)]
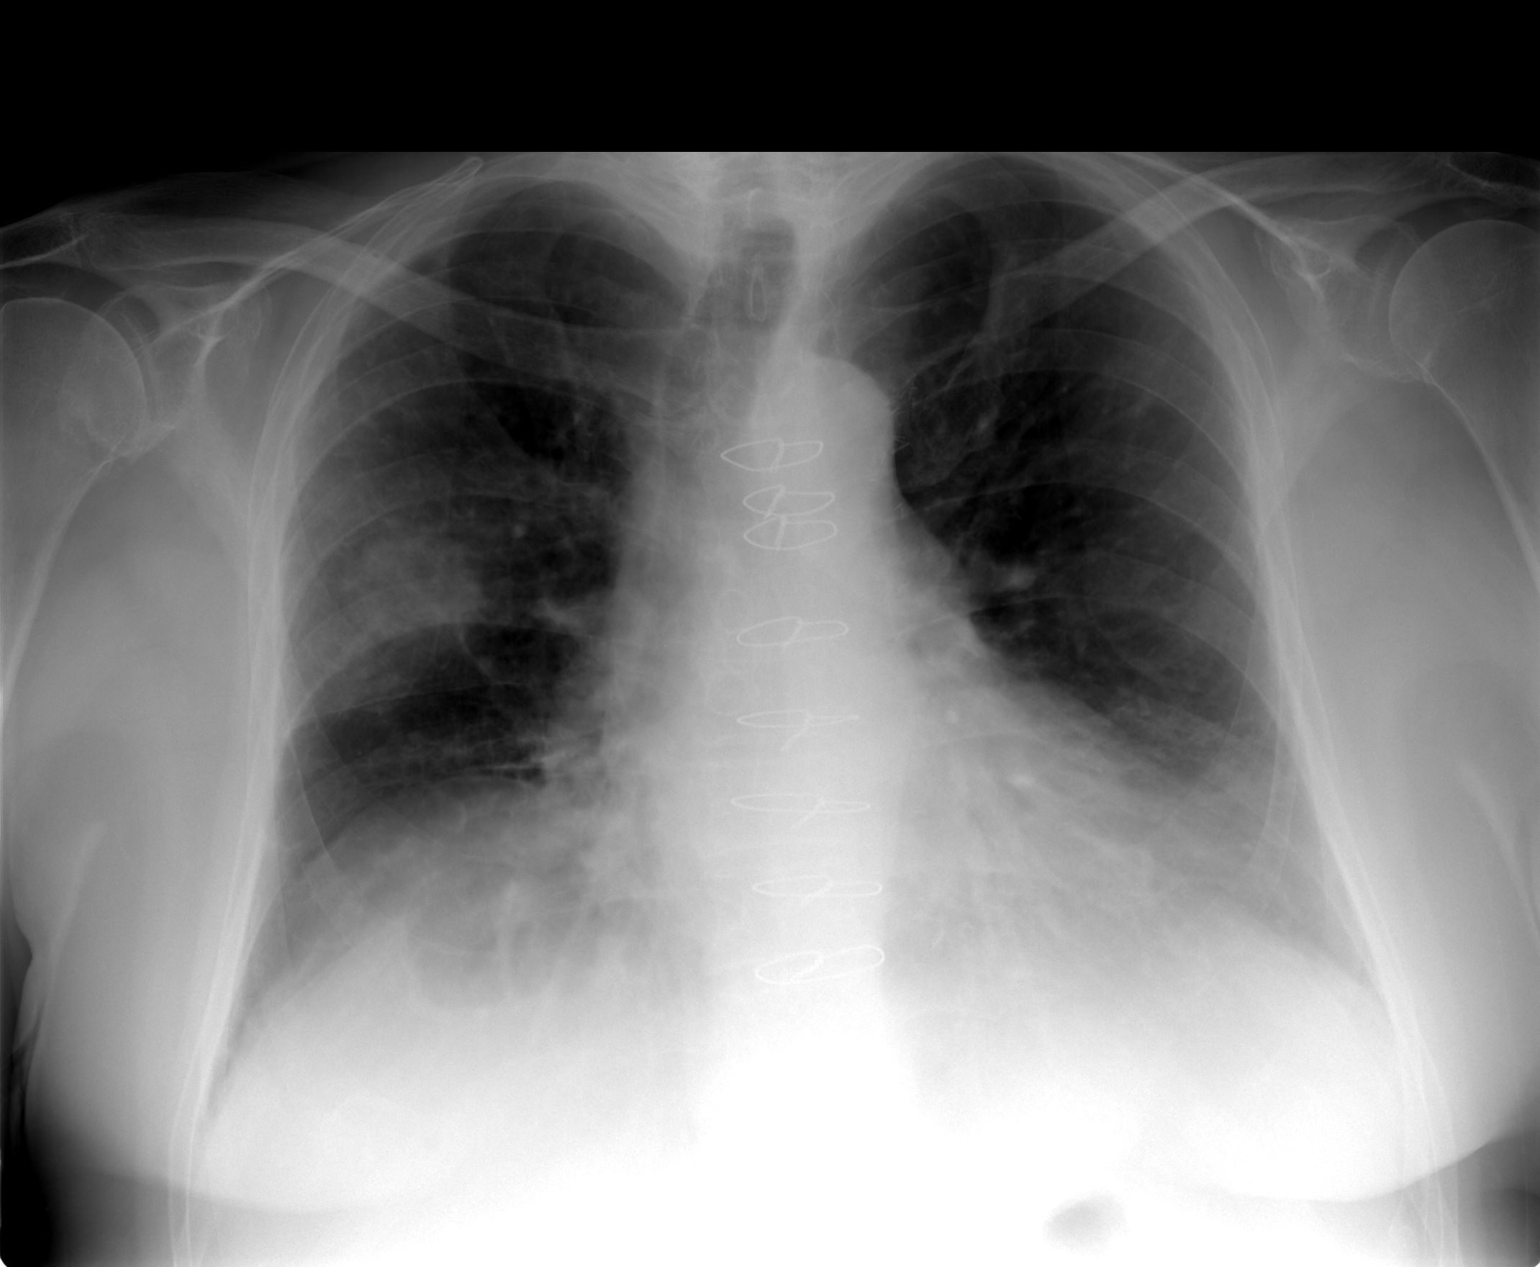

[view not recorded (2 of 2)]
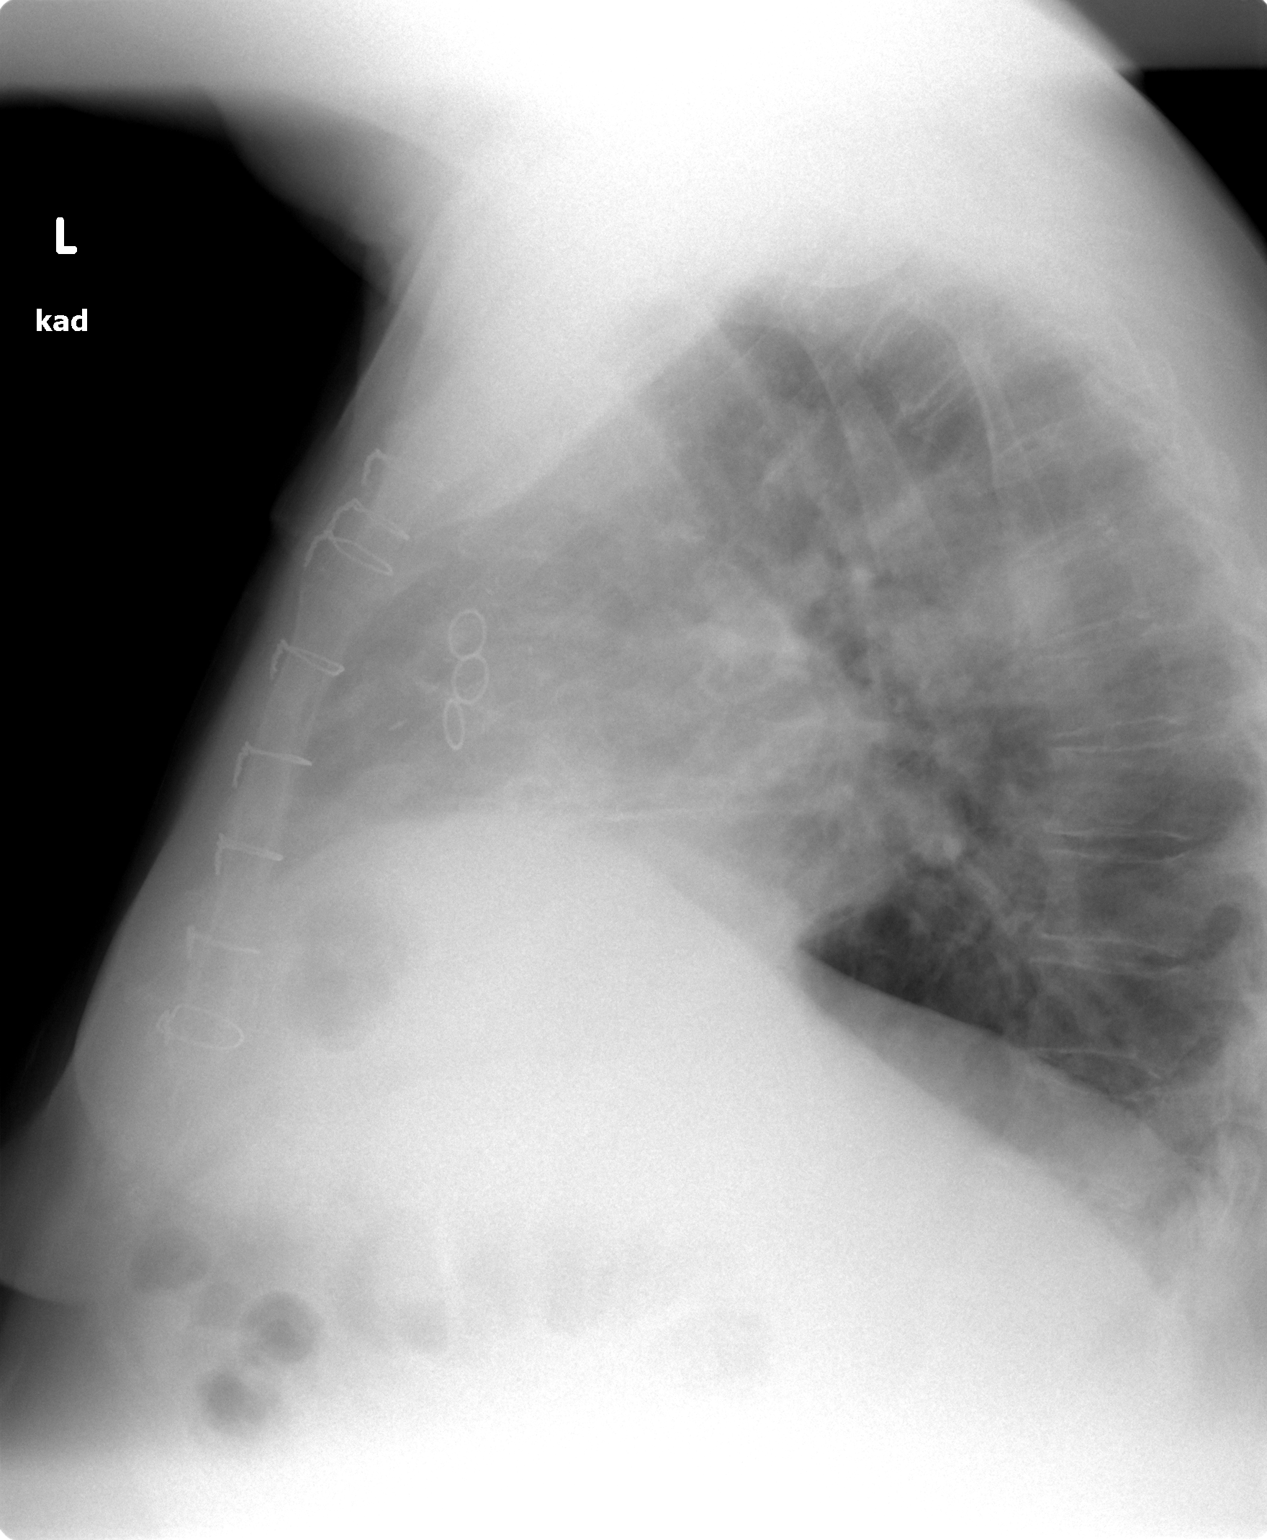

[2 of 2 positions shown; findings below may reference images not displayed]

FINDINGS: Somewhat rounded masslike airspace opacity in the right upper lobe,
new since prior study. Given the patient's history of cough and
fever, I suspect this represents pneumonia. However, recommend
followup after treatment to ensure resolution.

Prior CABG. Heart is upper limits normal in size. No confluent
opacity on the left. No effusions. No acute bony abnormality.
IMPRESSION: Rounded opacity in the right upper lobe. Given the history, I
suspect this represents pneumonia, but recommend followup after
treatment to ensure resolution.

## 2016-05-28 ENCOUNTER — Encounter: Payer: Self-pay | Admitting: Family Medicine

## 2016-05-28 ENCOUNTER — Ambulatory Visit (INDEPENDENT_AMBULATORY_CARE_PROVIDER_SITE_OTHER): Payer: Medicare Other | Admitting: Family Medicine

## 2016-05-28 DIAGNOSIS — L0201 Cutaneous abscess of face: Secondary | ICD-10-CM | POA: Diagnosis not present

## 2016-05-28 DIAGNOSIS — L89312 Pressure ulcer of right buttock, stage 2: Secondary | ICD-10-CM | POA: Diagnosis not present

## 2016-05-28 DIAGNOSIS — L89309 Pressure ulcer of unspecified buttock, unspecified stage: Secondary | ICD-10-CM | POA: Insufficient documentation

## 2016-05-28 MED ORDER — DOXYCYCLINE HYCLATE 100 MG PO TABS: 100.0000 mg | ORAL_TABLET | Freq: Two times a day (BID) | ORAL | 0 refills | Status: AC

## 2016-05-28 NOTE — Patient Instructions (Signed)
Start doxycycline.  Use warm compresses to keep the lesion draining.  Tylenol for pain.  If worse, then go to the ER.  Update [place] tomorrow.   Get a doughnut pillow for the hot spot on your buttock.  If that isn't gradually healing then let us know.   Take care.  Glad to see you.

## 2016-05-28 NOTE — Progress Notes (Signed)
Here today with family. Facial lesion and buttock lesion.  Left facial lesion have been noted for a few days. Has been progressively swelling, slightly more tender. Recently started draining purulent fluid. Can still chew. Can still open his mouth. No right-sided symptoms.  Buttock lesion likely present for weeks, not just present for a few days. Already getting smaller. He sits a lot during the day. He has not been using a cushion to offload weight. Lesion is reportedly getting smaller. Not tender.  Meds, vitals, and allergies reviewed.   ROS: Per HPI unless specifically indicated in ROS section   No apparent distress Left facial abscess noted in the parotid area. 4x4cm but appears to be superficial.  It does not communicate through the cheek into the oral cavity. It is draining externally. Pus can be expressed with minimal pressure. Is slightly tender. He has local erythema but no spreading erythema beyond the lesion itself. No lymphadenopathy. Mucous membranes moist RRR CTAB Right sided buttock lesion. Does not involve the rectum. Superficial. 1 x 1 cm. No spreading erythema. Not draining.

## 2016-05-28 NOTE — Assessment & Plan Note (Signed)
Small, superficial. Should resolve with pressure offloading. Discussed with patient and family by getting a cushion. Update [place] if not improving. He agrees.

## 2016-05-28 NOTE — Assessment & Plan Note (Signed)
The lesion is already draining. He doesn't have alarming symptoms at this point, vitals are stable, does not communicate into the mouth, has normal TMJ motion. Since it is already draining and significant pus can already be expressed, he is likely better off to start antibiotics, use warm compresses and update [place] tomorrow. If worse in the meantime then go to the emergency room. At this point he is still okay for outpatient follow-up. He agrees.

## 2016-05-28 NOTE — Progress Notes (Signed)
Pre visit review using our clinic review tool, if applicable. No additional management support is needed unless otherwise documented below in the visit note. 

## 2016-05-29 ENCOUNTER — Telehealth: Payer: Self-pay

## 2016-05-29 NOTE — Telephone Encounter (Signed)
Many thanks, glad to hear.  Update [place] at the end of the abx if not totally resolved.  If worse in the meantime then let us know.  Thanks.

## 2016-05-29 NOTE — Telephone Encounter (Signed)
Caller (caregiver) advised.

## 2016-05-29 NOTE — Telephone Encounter (Signed)
Not surprising, this is actually good to get the fluid out.  Continue as is.  Thanks.

## 2016-05-29 NOTE — Telephone Encounter (Signed)
Shawn Zamora [phone] called back to request a cb. He said the compresses are drawing out fluid in another opening a fraction below the existing one and he was concerned if that is ok or a reason to be concerned.

## 2016-05-29 NOTE — Telephone Encounter (Signed)
Shawn Zamora pts step son left v/m with update; pt rested more comfortably last night;  Infected area on face appears to be reducing in size; still draining pus; not hurting pt as much.Edward plans to continue treatment as prescribed. If needed for further instructions call Shawn Zamora.

## 2016-09-07 ENCOUNTER — Telehealth: Payer: Self-pay | Admitting: Family Medicine

## 2016-09-07 NOTE — Telephone Encounter (Signed)
Pt daughter will call back to schedule AWV and CPE  Revonda Standardllison call back # [phone]  NOTE* Last AWV 05/17/15

## 2016-12-20 IMAGING — US US SCROTUM
1 series · 14 of 25 positions shown · non-contrast
Comparison: None.

CLINICAL DATA: Swollen testicles of uncertain duration. No known
injury. Initial encounter.

EXAM:
ULTRASOUND OF SCROTUM
TECHNIQUE: Complete ultrasound examination of the testicles, epididymis, and
other scrotal structures was performed.

[Series 1: us scrotum · 0.06mm/px · 14 of 38 slices shown]
[im 1/38]
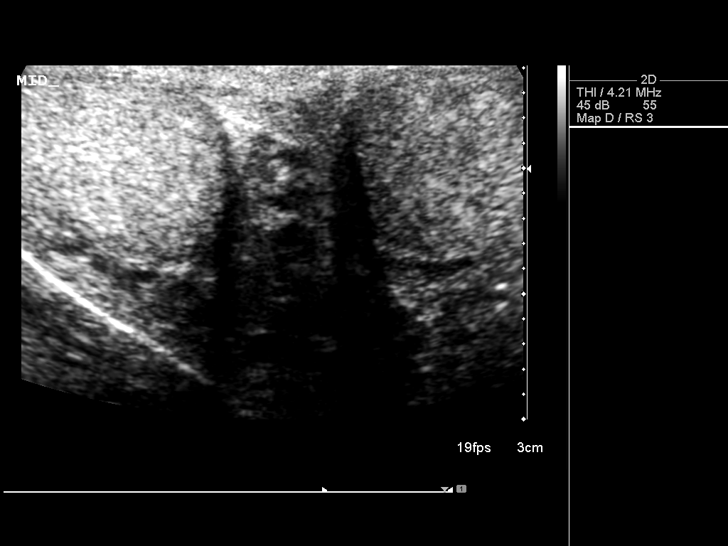
[im 4/38]
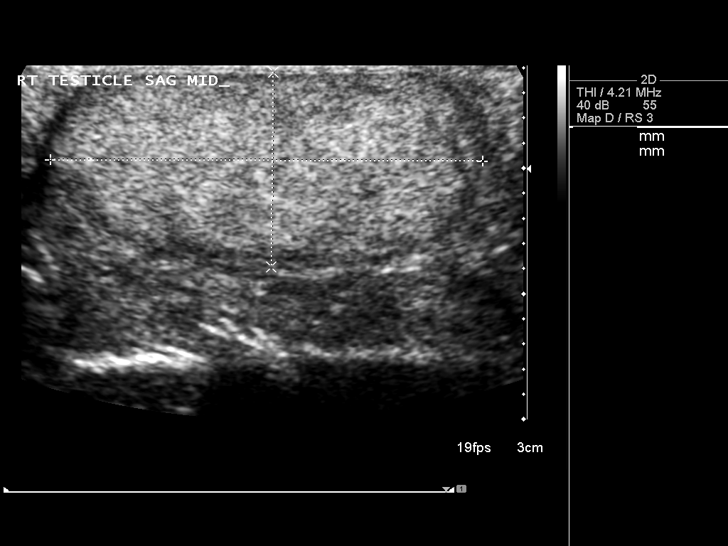
[im 7/38]
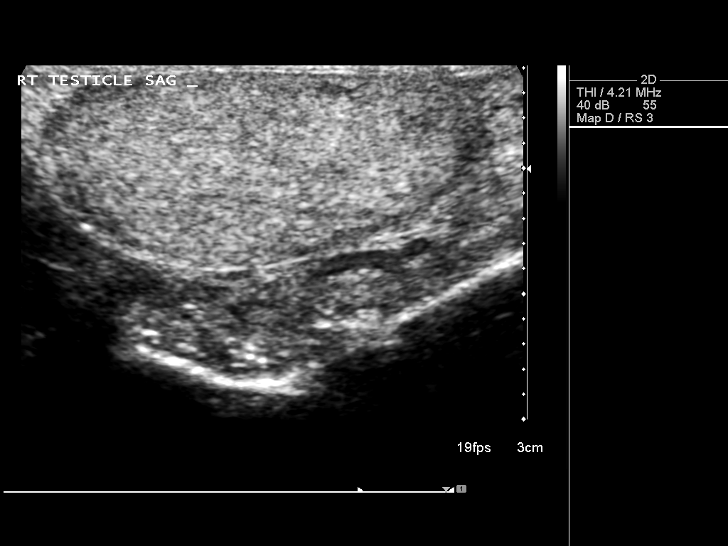
[im 10/38]
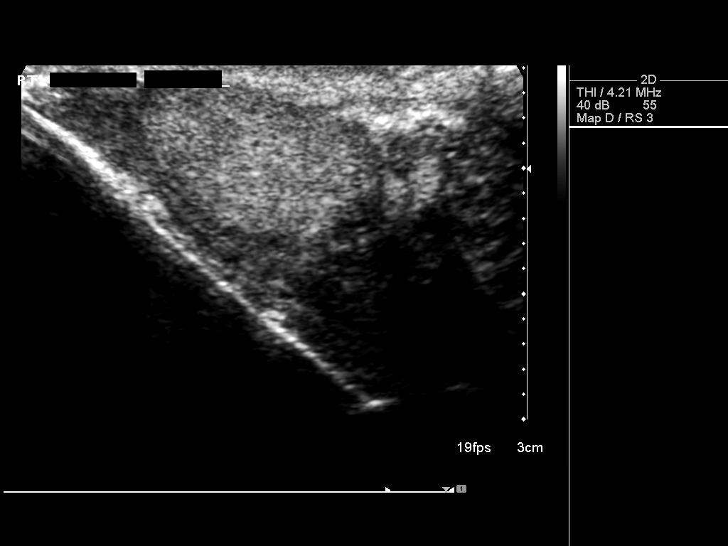
[im 13/38]
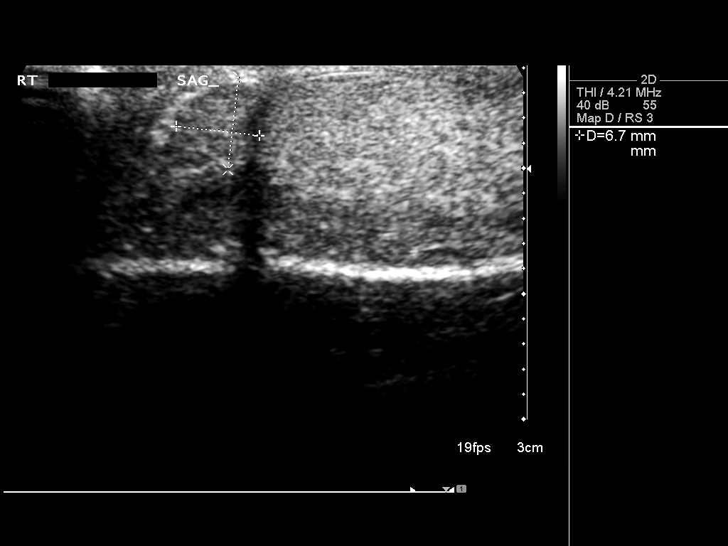
[im 14/38]
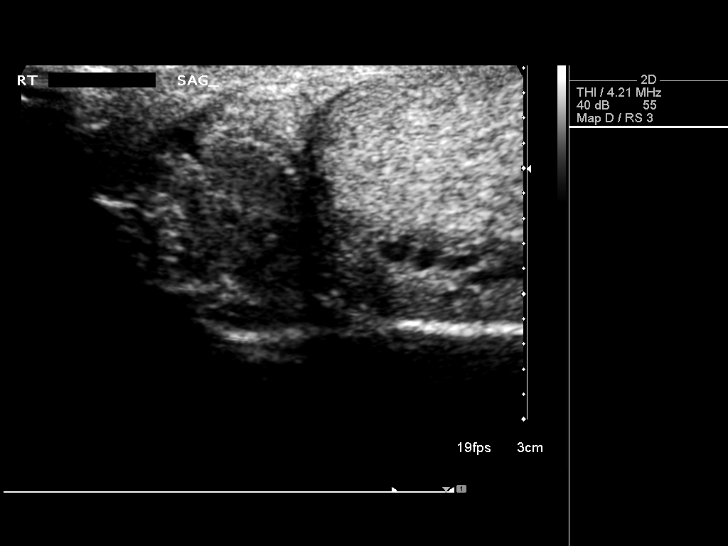
[im 17/38]
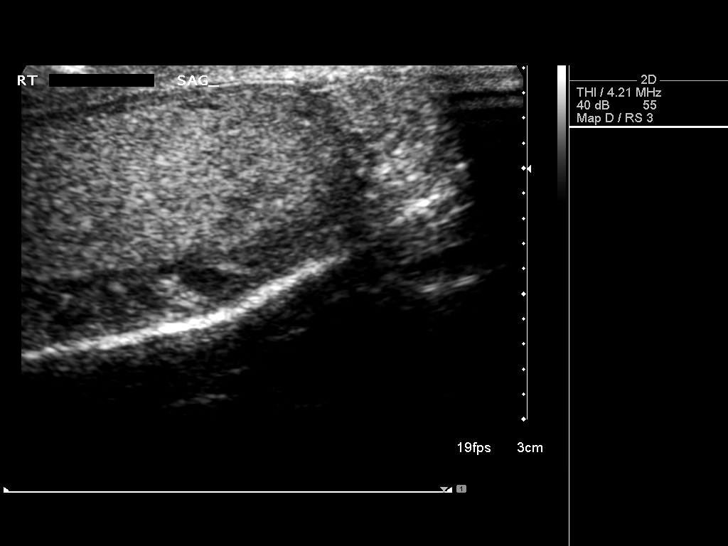
[im 21/38]
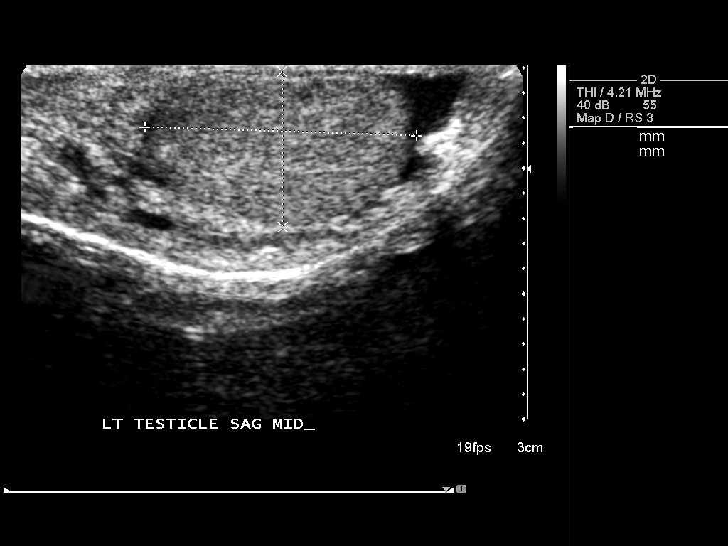
[im 24/38]
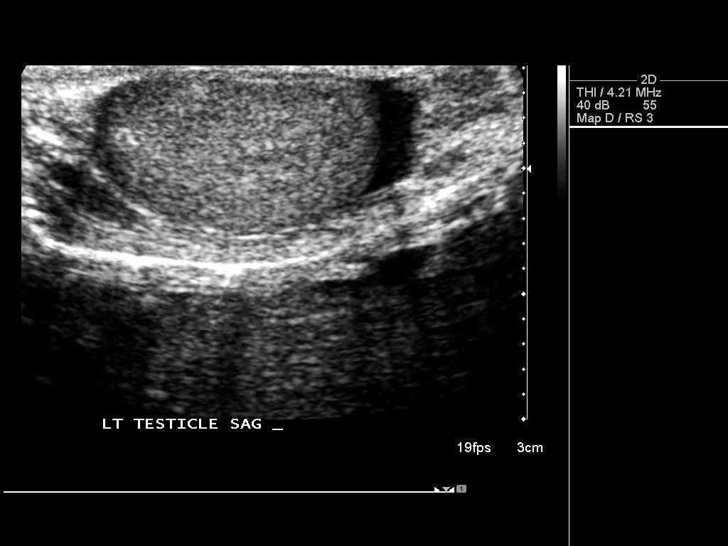
[im 25/38]
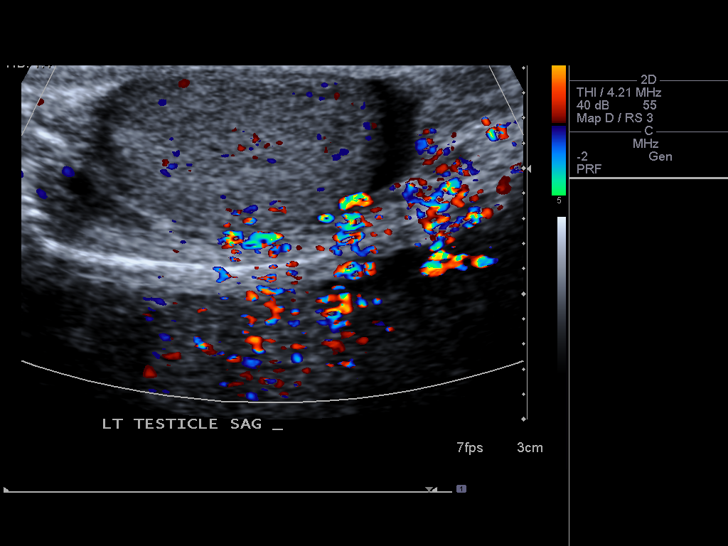
[im 28/38]
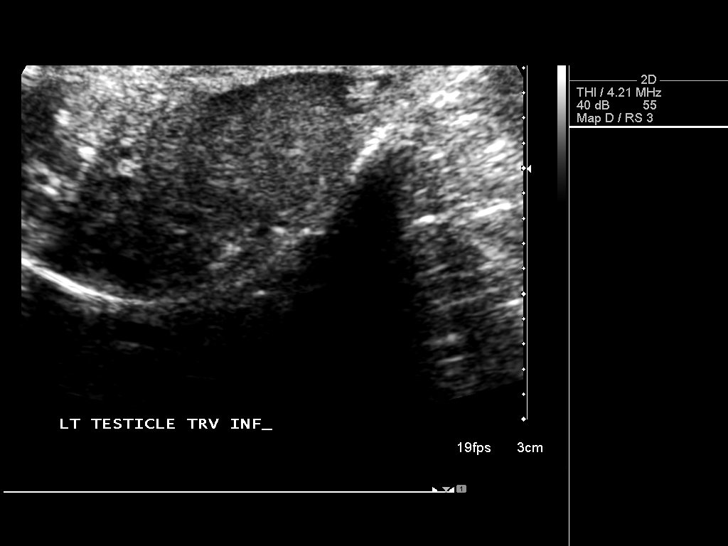
[im 31/38]
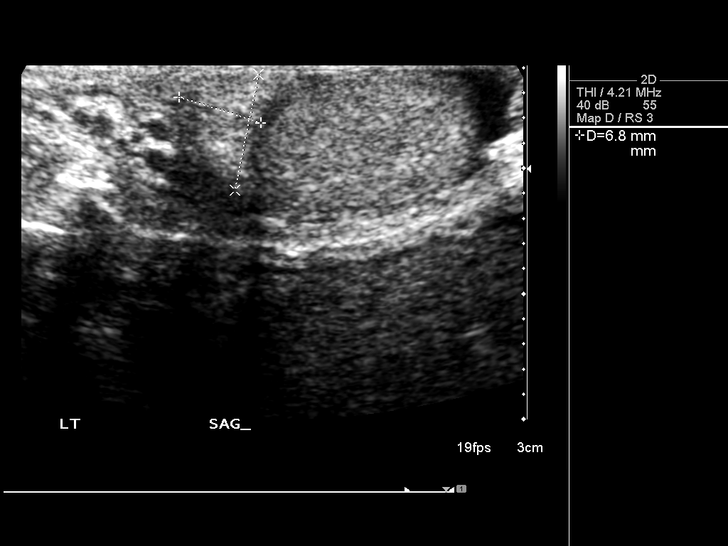
[im 34/38]
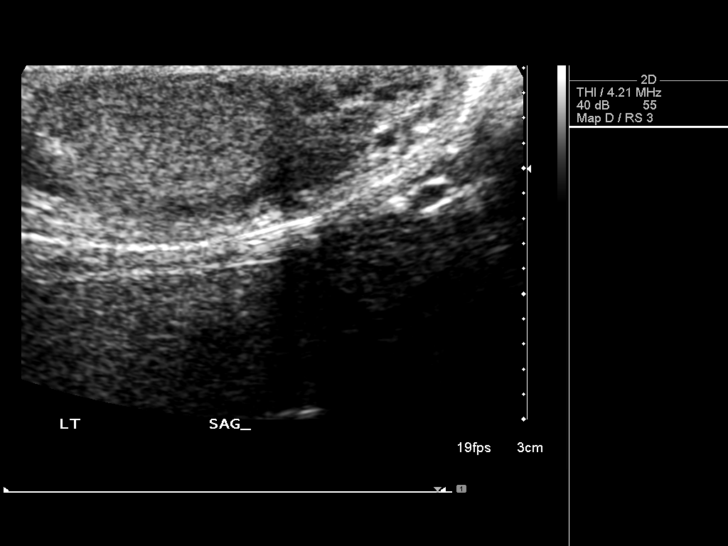
[im 38/38]
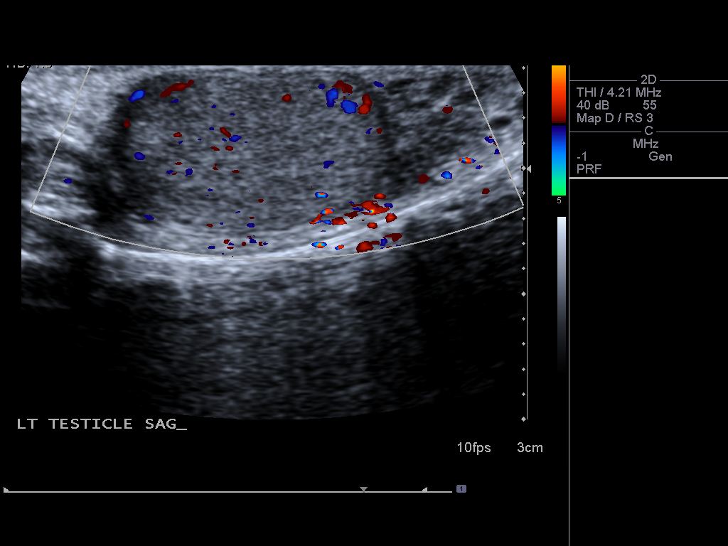

[14 of 25 positions shown; findings below may reference images not displayed]

FINDINGS: Right testicle

Measurements: 3.4 x 1.6 x 2.4 cm. No mass or microlithiasis
visualized.

Left testicle

Measurements: 2.2 x 1.2 x 2.0 cm. No mass or microlithiasis
visualized.

Right epididymis:  Normal in size and appearance.

Left epididymis:  Normal in size and appearance.

Hydrocele:  None visualized.

Varicocele:  None visualized.
IMPRESSION: No acute abnormality.  Mild atrophy of left testicle noted.

## 2017-10-29 DEATH — deceased
